# Patient Record
Sex: Female | Born: 1960
Health system: Southern US, Community
[De-identification: ages and names within clinical notes are randomized; demographics above are authoritative.]

## PROBLEM LIST (undated history)

## (undated) DIAGNOSIS — E039 Hypothyroidism, unspecified: Secondary | ICD-10-CM

## (undated) DIAGNOSIS — F329 Major depressive disorder, single episode, unspecified: Secondary | ICD-10-CM

## (undated) DIAGNOSIS — F32A Depression, unspecified: Secondary | ICD-10-CM

## (undated) DIAGNOSIS — M549 Dorsalgia, unspecified: Secondary | ICD-10-CM

## (undated) DIAGNOSIS — M79604 Pain in right leg: Secondary | ICD-10-CM

## (undated) DIAGNOSIS — M48061 Spinal stenosis, lumbar region without neurogenic claudication: Secondary | ICD-10-CM

## (undated) DIAGNOSIS — I10 Essential (primary) hypertension: Secondary | ICD-10-CM

## (undated) DIAGNOSIS — M25551 Pain in right hip: Secondary | ICD-10-CM

## (undated) DIAGNOSIS — I739 Peripheral vascular disease, unspecified: Secondary | ICD-10-CM

## (undated) DIAGNOSIS — M79605 Pain in left leg: Secondary | ICD-10-CM

## (undated) DIAGNOSIS — M419 Scoliosis, unspecified: Secondary | ICD-10-CM

## (undated) HISTORY — DX: Major depressive disorder, single episode, unspecified: F32.9

## (undated) HISTORY — DX: Hypothyroidism, unspecified: E03.9

## (undated) HISTORY — DX: Peripheral vascular disease, unspecified: I73.9

## (undated) HISTORY — DX: Essential (primary) hypertension: I10

## (undated) HISTORY — DX: Scoliosis, unspecified: M41.9

## (undated) HISTORY — DX: Pain in right leg: M79.605

## (undated) HISTORY — DX: Spinal stenosis, lumbar region without neurogenic claudication: M48.061

## (undated) HISTORY — DX: Depression, unspecified: F32.A

## (undated) HISTORY — DX: Pain in right leg: M79.604

## (undated) HISTORY — DX: Pain in left hip: M25.551

## (undated) HISTORY — DX: Dorsalgia, unspecified: M54.9

---

## 1999-05-18 ENCOUNTER — Other Ambulatory Visit: Admission: RE | Admit: 1999-05-18 | Discharge: 1999-05-18 | Payer: Self-pay | Admitting: Obstetrics & Gynecology

## 2000-08-12 ENCOUNTER — Other Ambulatory Visit: Admission: RE | Admit: 2000-08-12 | Discharge: 2000-08-12 | Payer: Self-pay | Admitting: Obstetrics & Gynecology

## 2001-10-14 ENCOUNTER — Other Ambulatory Visit: Admission: RE | Admit: 2001-10-14 | Discharge: 2001-10-14 | Payer: Self-pay | Admitting: Obstetrics & Gynecology

## 2003-08-31 ENCOUNTER — Other Ambulatory Visit: Admission: RE | Admit: 2003-08-31 | Discharge: 2003-08-31 | Payer: Self-pay | Admitting: Obstetrics & Gynecology

## 2004-12-26 ENCOUNTER — Other Ambulatory Visit: Admission: RE | Admit: 2004-12-26 | Discharge: 2004-12-26 | Payer: Self-pay | Admitting: Obstetrics & Gynecology

## 2009-10-04 ENCOUNTER — Encounter (INDEPENDENT_AMBULATORY_CARE_PROVIDER_SITE_OTHER): Payer: Self-pay | Admitting: *Deleted

## 2009-11-17 ENCOUNTER — Encounter (INDEPENDENT_AMBULATORY_CARE_PROVIDER_SITE_OTHER): Payer: Self-pay | Admitting: *Deleted

## 2009-11-18 ENCOUNTER — Ambulatory Visit: Payer: Self-pay | Admitting: Internal Medicine

## 2009-12-05 ENCOUNTER — Ambulatory Visit: Payer: Self-pay | Admitting: Internal Medicine

## 2011-01-16 NOTE — Letter (Signed)
Summary: [facility] Instructions  [facility]  [address] [place], [place] [address]   Phone: [phone]  Fax: [phone]       Cynthia Schaefer    02/14/1961    MRN: [id number]        Procedure Day /Date: Monday 12/05/2009     Arrival Time 7:30 am      Procedure Time: 8:30 am     Location of Procedure:                    _x _  [facility] (4th Floor)                        PREPARATION FOR COLONOSCOPY WITH MOVIPREP   Starting 5 days prior to your procedure Wednesday 12/15 do not eat nuts, seeds, popcorn, corn, beans, peas,  salads, or any raw vegetables.  Do not take any fiber supplements (e.g. Metamucil, Citrucel, and Benefiber).  THE DAY BEFORE YOUR PROCEDURE         DATE: 12/19   DAY: Sunday  1.  Drink clear liquids the entire day-NO SOLID FOOD  2.  Do not drink anything colored red or purple.  Avoid juices with pulp.  No orange juice.  3.  Drink at least 64 oz. (8 glasses) of fluid/clear liquids during the day to prevent dehydration and help the prep work efficiently.  CLEAR LIQUIDS INCLUDE: Water Jello Ice Popsicles Tea (sugar ok, no milk/cream) Powdered fruit flavored drinks Coffee (sugar ok, no milk/cream) Gatorade Juice: apple, white grape, white cranberry  Lemonade Clear bullion, consomm, broth Carbonated beverages (any kind) Strained chicken noodle soup Hard Candy                             4.  In the morning, mix first dose of MoviPrep solution:    Empty 1 Pouch A and 1 Pouch B into the disposable container    Add lukewarm drinking water to the top line of the container. Mix to dissolve    Refrigerate (mixed solution should be used within 24 hrs)  5.  Begin drinking the prep at 5:00 p.m. The MoviPrep container is divided by 4 marks.   Every 15 minutes drink the solution down to the next mark (approximately 8 oz) until the full liter is complete.   6.  Follow completed prep with 16 oz of clear liquid of your choice  (Nothing red or purple).  Continue to drink clear liquids until bedtime.  7.  Before going to bed, mix second dose of MoviPrep solution:    Empty 1 Pouch A and 1 Pouch B into the disposable container    Add lukewarm drinking water to the top line of the container. Mix to dissolve    Refrigerate  THE DAY OF YOUR PROCEDURE      DATE: 12/20  DAY: Monday  Beginning at 3:30 a.m. (5 hours before procedure):         1. Every 15 minutes, drink the solution down to the next mark (approx 8 oz) until the full liter is complete.  2. Follow completed prep with 16 oz. of clear liquid of your choice.    3. You may drink clear liquids until  6:30 am (2 HOURS BEFORE PROCEDURE).   MEDICATION INSTRUCTIONS  Unless otherwise instructed, you should take regular prescription medications with a small sip of water   as early  as possible the morning of your procedure.        OTHER INSTRUCTIONS  You will need a responsible adult at least 52 years of age to accompany you and drive you home.   This person must remain in the waiting room during your procedure.  Wear loose fitting clothing that is easily removed.  Leave jewelry and other valuables at home.  However, you may wish to bring a book to read or  an iPod/MP3 player to listen to music as you wait for your procedure to start.  Remove all body piercing jewelry and leave at home.  Total time from sign-in until discharge is approximately 2-3 hours.  You should go home directly after your procedure and rest.  You can resume normal activities the  day after your procedure.  The day of your procedure you should not:   Drive   Make legal decisions   Operate machinery   Drink alcohol   Return to work  You will receive specific instructions about eating, activities and medications before you leave.    The above instructions have been reviewed and explained to me by  Ezra Sites RN  November 18, 2009 11:21 AM    I fully understand  and can verbalize these instructions _____________________________ Date _________

## 2011-01-16 NOTE — Procedures (Signed)
Summary: Colonoscopy  Patient: Cynthia Schaefer Note: All result statuses are Final unless otherwise noted.  Tests: (1) Colonoscopy (COL)   COL Colonoscopy           DONE     [facility]     [address]     [place], [place]  [address]           COLONOSCOPY PROCEDURE REPORT           PATIENT:  [name], Shave  MR#:  [id number]     BIRTHDATE:  12-May-1961, 48 yrs. old  GENDER:  female           ENDOSCOPIST:  Wilhemina Bonito. Eda Keys, MD     Referred by:  .Direct Self,           PROCEDURE DATE:  12/05/2009     PROCEDURE:  Colonoscopy with snare polypectomy     EXTENDED SERVICE (TIME > 40 MIN)     ASA CLASS:  Class II     INDICATIONS:  Routine Risk Screening           MEDICATIONS:   Fentanyl 75 mcg IV, Versed 8 mg IV           DESCRIPTION OF PROCEDURE:   After the risks benefits and     alternatives of the procedure were thoroughly explained, informed     consent was obtained.  Digital rectal exam was performed and     revealed no abnormalities.   The LB CF-H180AL [id number] and LB     CF-H180AL [id number] endoscope was introduced through the anus and     advanced to the cecum, which was identified by both the appendix     and ileocecal valve, limited by fair prep.    The quality of the     prep was Moviprep fair. Time to cecum = 4:58 min. The instrument     was then slowly withdrawn (time = 31:40 min) as the colon was     fully examined.     <<PROCEDUREIMAGES>>           FINDINGS:  The prep was fair and compromising. However, with     extended time for irrigation and suctioning (actually had to     exchange scopes as well due to clogged channel) and great effort     it was upgraded to good. A diminutive adenomatous appearring polyp     was found in the sigmoid colon. Polyp was snared without cautery.     Retrieval was NOT successful.   Mild diverticulosis was found in     the sigmoid colon.  This was otherwise a normal examination of the     colon.   Retroflexed views in the  rectum revealed no     abnormalities.    The scope was then withdrawn from the patient     and the procedure completed.           COMPLICATIONS:  None           ENDOSCOPIC IMPRESSION:     1) Diminutive polyp in the sigmoid colon -removed     2) Mild diverticulosis in the sigmoid colon     3) Otherwise normal examination           RECOMMENDATIONS:     1) Follow up colonoscopy in 5 years           ______________________________     Wilhemina Bonito. Eda Keys, MD  CC:  Desmond Dike, MD; The Patient           n.     eSIGNED:   Wilhemina Bonito. Eda Keys at 12/05/2009 09:32 AM           Sabino Donovan, [id number]  Note: An exclamation mark (!) indicates a result that was not dispersed into the flowsheet. Document Creation Date: 12/05/2009 9:30 AM _______________________________________________________________________  (1) Order result status: Final Collection or observation date-time: 12/05/2009 09:22 Requested date-time:  Receipt date-time:  Reported date-time:  Referring Physician:   Ordering Physician: Fransico Setters [phone]) Specimen Source:  Source: Launa Grill Order Number: [id number] Lab site:

## 2011-01-16 NOTE — Miscellaneous (Signed)
Summary: [facility]  Clinical Lists Changes  Medications: Added new medication of MOVIPREP 100 GM  SOLR (PEG-KCL-NACL-NASULF-NA ASC-C) As per prep instructions. - Signed Rx of MOVIPREP 100 GM  SOLR (PEG-KCL-NACL-NASULF-NA ASC-C) As per prep instructions.;  #1 x 0;  Signed;  Entered by: Ezra Sites RN;  Authorized by: Hilarie Fredrickson MD;  Method used: Electronically to [facility]  [address] [phone]*, [address] [place], [place], [place]  [address], Ph: [phone] or [phone], Fax: [phone] Allergies: Added new allergy or adverse reaction of FLAGYL Observations: Added new observation of NKA: F (11/18/2009 10:58)    Prescriptions: MOVIPREP 100 GM  SOLR (PEG-KCL-NACL-NASULF-NA ASC-C) As per prep instructions.  #1 x 0   Entered by:   Ezra Sites RN   Authorized by:   Hilarie Fredrickson MD   Signed by:   Ezra Sites RN on 11/18/2009   Method used:   Electronically to        [facility]  [address] [phone]* (retail)       [address]       [place], [place]  [address]       Ph: [phone] or [phone]       Fax: [phone]   RxID:   [id number]

## 2011-01-16 NOTE — Letter (Signed)
Summary: Previsit letter  [facility]  [address] [place], [place] [address]   Phone: [phone]  Fax: [phone]     10/04/2009 MRN: [id number]  [facility] [address] [place], [place]  [address]  Dear Cynthia Schaefer,  Welcome to the Gastroenterology Division at [facility].    You are scheduled to see a nurse for your pre-procedure visit on 11/18/2009 at 11AM on the 3rd floor at [facility], [address]  We ask that you try to arrive at our office 15 minutes prior to your appointment time to allow for check-in.  Your nurse visit will consist of discussing your medical and surgical history, your immediate family medical history, and your medications.    Please bring a complete list of all your medications or, if you prefer, bring the medication bottles and we will list them.  We will need to be aware of both prescribed and over the counter drugs.  We will need to know exact dosage information as well.  If you are on blood thinners (Coumadin, Plavix, Aggrenox, Ticlid, etc.) please call our office today/prior to your appointment, as we need to consult with your physician about holding your medication.   Please be prepared to read and sign documents such as consent forms, a financial agreement, and acknowledgement forms.  If necessary, and with your consent, a friend or relative is welcome to sit-in on the nurse visit with you.  Please bring your insurance card so that we may make a copy of it.  If your insurance requires a referral to see a specialist, please bring your referral form from your primary care physician.  No co-pay is required for this nurse visit.     If you cannot keep your appointment, please call [phone] to cancel or reschedule prior to your appointment date.  This allows [place] the opportunity to schedule an appointment for another patient in need of care.    Thank you for choosing [facility] for your medical  needs.  We appreciate the opportunity to care for you.  Please visit [place] at our website  to learn more about our practice.                     Sincerely.                                                                                                                   The Gastroenterology Division

## 2014-09-29 ENCOUNTER — Encounter: Payer: Self-pay | Admitting: Internal Medicine

## 2014-10-08 ENCOUNTER — Encounter: Payer: Self-pay | Admitting: Internal Medicine

## 2014-10-27 ENCOUNTER — Telehealth: Payer: Self-pay | Admitting: *Deleted

## 2014-10-27 NOTE — Telephone Encounter (Signed)
Thanks. entered into prep.  Cynthia Schaefer   PV

## 2014-10-27 NOTE — Telephone Encounter (Signed)
Dr Marina GoodellPerry, Reviewing this chart this am for Cynthia Schaefer. Noticed at her last colon 12-05-09 she used moviprep. In your report you states her prep was fair and compromising and needed extensive irrigation. She has hx of colon polyps. Do you want her to have a more extensive prep for her colon 12-3?  Please advise.  Thanks, marie Cynthia Schaefer

## 2014-10-27 NOTE — Telephone Encounter (Signed)
Clears for 2 days. One bottle of mag citrate am day before, then standard Movi prep. Thanks

## 2014-11-18 ENCOUNTER — Encounter: Payer: Self-pay | Admitting: Internal Medicine

## 2015-01-28 ENCOUNTER — Encounter: Payer: Self-pay | Admitting: Internal Medicine

## 2015-03-24 ENCOUNTER — Ambulatory Visit (AMBULATORY_SURGERY_CENTER): Payer: Self-pay | Admitting: *Deleted

## 2015-03-24 VITALS — Ht 66.5 in | Wt 199.0 lb

## 2015-03-24 DIAGNOSIS — Z86010 Personal history of colonic polyps: Secondary | ICD-10-CM

## 2015-03-24 MED ORDER — MOVIPREP 100 G PO SOLR: 1.0000 | Freq: Once | ORAL | Status: DC

## 2015-03-24 NOTE — Progress Notes (Signed)
Denies allergies to eggs or soy products. Denies complications with sedation or anesthesia. Denies O2 use. Denies use of diet or weight loss medications.  Emmi instructions given for colonoscopy.  

## 2015-03-28 ENCOUNTER — Encounter: Payer: Self-pay | Admitting: Internal Medicine

## 2015-04-08 ENCOUNTER — Encounter: Payer: Self-pay | Admitting: Internal Medicine

## 2015-04-08 ENCOUNTER — Ambulatory Visit (AMBULATORY_SURGERY_CENTER): Payer: BLUE CROSS/BLUE SHIELD | Admitting: Internal Medicine

## 2015-04-08 VITALS — BP 130/94 | HR 66 | Temp 99.1°F | Resp 17 | Ht 66.0 in | Wt 199.0 lb

## 2015-04-08 DIAGNOSIS — Z86010 Personal history of colonic polyps: Secondary | ICD-10-CM

## 2015-04-08 MED ORDER — SODIUM CHLORIDE 0.9 % IV SOLN: 500.0000 mL | INTRAVENOUS | Status: DC

## 2015-04-08 NOTE — Patient Instructions (Signed)
YOU HAD AN ENDOSCOPIC PROCEDURE TODAY AT THE [facility]:   Refer to the procedure report that was given to you for any specific questions about what was found during the examination.  If the procedure report does not answer your questions, please call your gastroenterologist to clarify.  If you requested that your care partner not be given the details of your procedure findings, then the procedure report has been included in a sealed envelope for you to review at your convenience later.  YOU SHOULD EXPECT: Some feelings of bloating in the abdomen. Passage of more gas than usual.  Walking can help get rid of the air that was put into your GI tract during the procedure and reduce the bloating. If you had a lower endoscopy (such as a colonoscopy or flexible sigmoidoscopy) you may notice spotting of blood in your stool or on the toilet paper. If you underwent a bowel prep for your procedure, you may not have a normal bowel movement for a few days.  Please Note:  You might notice some irritation and congestion in your nose or some drainage.  This is from the oxygen used during your procedure.  There is no need for concern and it should clear up in a day or so.  SYMPTOMS TO REPORT IMMEDIATELY:   Following lower endoscopy (colonoscopy or flexible sigmoidoscopy):  Excessive amounts of blood in the stool  Significant tenderness or worsening of abdominal pains  Swelling of the abdomen that is new, acute  Fever of 100F or higher    For urgent or emergent issues, a gastroenterologist can be reached at any hour by calling [phone].   DIET: Your first meal following the procedure should be a small meal and then it is ok to progress to your normal diet. Heavy or fried foods are harder to digest and may make you feel nauseous or bloated.  Likewise, meals heavy in dairy and vegetables can increase bloating.  Drink plenty of fluids but you should avoid alcoholic beverages for 24  hours.  ACTIVITY:  You should plan to take it easy for the rest of today and you should NOT DRIVE or use heavy machinery until tomorrow (because of the sedation medicines used during the test).    FOLLOW UP: Our staff will call the number listed on your records the next business day following your procedure to check on you and address any questions or concerns that you may have regarding the information given to you following your procedure. If we do not reach you, we will leave a message.  However, if you are feeling well and you are not experiencing any problems, there is no need to return our call.  We will assume that you have returned to your regular daily activities without incident.  If any biopsies were taken you will be contacted by phone or by letter within the next 1-3 weeks.  Please call us at [phone] if you have not heard about the biopsies in 3 weeks.    SIGNATURES/CONFIDENTIALITY: You and/or your care partner have signed paperwork which will be entered into your electronic medical record.  These signatures attest to the fact that that the information above on your After Visit Summary has been reviewed and is understood.  Full responsibility of the confidentiality of this discharge information lies with you and/or your care-partner.   Information on diverticulosis & high fiber diet given to you today  

## 2015-04-08 NOTE — Progress Notes (Signed)
A/ox3 pleased with MAC, report to Penny RN 

## 2015-04-08 NOTE — Op Note (Signed)
[facility] [address] [place], [id number]   COLONOSCOPY PROCEDURE REPORT  PATIENT: Cynthia Schaefer, Casady J  MR#: [id number] BIRTHDATE: 1961/09/27 , 53  yrs. old GENDER: female ENDOSCOPIST: Roxy CedarJohn N Rache Klimaszewski Jr, MD REFERRED OZ:[id number]:Surveillance Program Recall PROCEDURE DATE:  04/08/2015 PROCEDURE:   Colonoscopy, surveillance First Screening Colonoscopy - Avg.  risk and is 50 yrs.  old or older - No.  Prior Negative Screening - Now for repeat screening. N/A  History of Adenoma - Now for follow-up colonoscopy & has been > or = to 3 yrs.  Yes hx of adenoma.  Has been 3 or more years since last colonoscopy. ASA CLASS:   Class II INDICATIONS:Surveillance due to prior colonic neoplasia and PH Colon Adenoma.   Prior exam 11-2009 (Fair prep; adenomatous looking polyp removed, not retrieved) MEDICATIONS: Monitored anesthesia care and Propofol 200 mg IV  DESCRIPTION OF PROCEDURE:   After the risks benefits and alternatives of the procedure were thoroughly explained, informed consent was obtained.  The digital rectal exam revealed no abnormalities of the rectum.   The LB [id number]  endoscope was introduced through the anus and advanced to the cecum, which was identified by both the appendix and ileocecal valve. No adverse events experienced.   The quality of the prep was good.  (MoviPrep was used)  The instrument was then slowly withdrawn as the colon was fully examined.      COLON FINDINGS: There was mild diverticulosis noted in the sigmoid colon.   The examination was otherwise normal.  Retroflexed views revealed no abnormalities. The time to cecum = 4.6 Withdrawal time = 15.0   The scope was withdrawn and the procedure completed. COMPLICATIONS: There were no immediate complications.  ENDOSCOPIC IMPRESSION: 1.   Mild diverticulosis was noted in the sigmoid colon 2.   The examination was otherwise normal  RECOMMENDATIONS: 1. Continue current colorectal screening  recommendations for "routine risk" patients with a repeat colonoscopy in 10 years.  eSigned:  Roxy CedarJohn N Esmay Amspacher Jr, MD 04/08/2015 10:21 AM   cc: The Patient and Blane Oharaox, Kirsten MD

## 2015-04-11 ENCOUNTER — Telehealth: Payer: Self-pay

## 2015-04-11 NOTE — Telephone Encounter (Signed)
No answer, left voicemail message.

## 2015-06-10 ENCOUNTER — Encounter: Payer: Self-pay | Admitting: Internal Medicine

## 2016-09-27 DIAGNOSIS — F320 Major depressive disorder, single episode, mild: Secondary | ICD-10-CM | POA: Diagnosis not present

## 2016-09-27 DIAGNOSIS — Z79899 Other long term (current) drug therapy: Secondary | ICD-10-CM | POA: Diagnosis not present

## 2016-09-27 DIAGNOSIS — K5909 Other constipation: Secondary | ICD-10-CM | POA: Diagnosis not present

## 2016-09-27 DIAGNOSIS — E038 Other specified hypothyroidism: Secondary | ICD-10-CM | POA: Diagnosis not present

## 2016-09-27 DIAGNOSIS — E782 Mixed hyperlipidemia: Secondary | ICD-10-CM | POA: Diagnosis not present

## 2016-10-10 DIAGNOSIS — M48062 Spinal stenosis, lumbar region with neurogenic claudication: Secondary | ICD-10-CM | POA: Diagnosis not present

## 2016-10-10 DIAGNOSIS — M5416 Radiculopathy, lumbar region: Secondary | ICD-10-CM | POA: Diagnosis not present

## 2016-10-10 DIAGNOSIS — Z6834 Body mass index (BMI) 34.0-34.9, adult: Secondary | ICD-10-CM | POA: Diagnosis not present

## 2016-10-10 DIAGNOSIS — R030 Elevated blood-pressure reading, without diagnosis of hypertension: Secondary | ICD-10-CM | POA: Diagnosis not present

## 2016-10-25 DIAGNOSIS — E663 Overweight: Secondary | ICD-10-CM | POA: Diagnosis not present

## 2016-10-25 DIAGNOSIS — Z0000 Encounter for general adult medical examination without abnormal findings: Secondary | ICD-10-CM | POA: Diagnosis not present

## 2016-10-25 DIAGNOSIS — Z6832 Body mass index (BMI) 32.0-32.9, adult: Secondary | ICD-10-CM | POA: Diagnosis not present

## 2016-10-25 DIAGNOSIS — Z1211 Encounter for screening for malignant neoplasm of colon: Secondary | ICD-10-CM | POA: Diagnosis not present

## 2016-11-15 DIAGNOSIS — Z1211 Encounter for screening for malignant neoplasm of colon: Secondary | ICD-10-CM | POA: Diagnosis not present

## 2016-12-24 DIAGNOSIS — R5081 Fever presenting with conditions classified elsewhere: Secondary | ICD-10-CM | POA: Diagnosis not present

## 2016-12-24 DIAGNOSIS — J208 Acute bronchitis due to other specified organisms: Secondary | ICD-10-CM | POA: Diagnosis not present

## 2016-12-24 DIAGNOSIS — J018 Other acute sinusitis: Secondary | ICD-10-CM | POA: Diagnosis not present

## 2017-06-11 DIAGNOSIS — M48062 Spinal stenosis, lumbar region with neurogenic claudication: Secondary | ICD-10-CM | POA: Diagnosis not present

## 2017-06-11 DIAGNOSIS — M4120 Other idiopathic scoliosis, site unspecified: Secondary | ICD-10-CM | POA: Diagnosis not present

## 2017-08-20 DIAGNOSIS — Z01419 Encounter for gynecological examination (general) (routine) without abnormal findings: Secondary | ICD-10-CM | POA: Diagnosis not present

## 2017-08-20 DIAGNOSIS — Z6832 Body mass index (BMI) 32.0-32.9, adult: Secondary | ICD-10-CM | POA: Diagnosis not present

## 2017-08-20 DIAGNOSIS — Z1231 Encounter for screening mammogram for malignant neoplasm of breast: Secondary | ICD-10-CM | POA: Diagnosis not present

## 2017-09-04 DIAGNOSIS — R600 Localized edema: Secondary | ICD-10-CM | POA: Diagnosis not present

## 2017-09-04 DIAGNOSIS — W57XXXA Bitten or stung by nonvenomous insect and other nonvenomous arthropods, initial encounter: Secondary | ICD-10-CM | POA: Diagnosis not present

## 2017-11-01 DIAGNOSIS — Z23 Encounter for immunization: Secondary | ICD-10-CM | POA: Diagnosis not present

## 2017-11-01 DIAGNOSIS — Z0000 Encounter for general adult medical examination without abnormal findings: Secondary | ICD-10-CM | POA: Diagnosis not present

## 2018-01-13 DIAGNOSIS — S61412A Laceration without foreign body of left hand, initial encounter: Secondary | ICD-10-CM | POA: Diagnosis not present

## 2018-08-08 DIAGNOSIS — F320 Major depressive disorder, single episode, mild: Secondary | ICD-10-CM | POA: Diagnosis not present

## 2018-08-08 DIAGNOSIS — M170 Bilateral primary osteoarthritis of knee: Secondary | ICD-10-CM | POA: Diagnosis not present

## 2018-08-08 DIAGNOSIS — E782 Mixed hyperlipidemia: Secondary | ICD-10-CM | POA: Diagnosis not present

## 2018-08-08 DIAGNOSIS — E038 Other specified hypothyroidism: Secondary | ICD-10-CM | POA: Diagnosis not present

## 2018-09-15 DIAGNOSIS — Z01419 Encounter for gynecological examination (general) (routine) without abnormal findings: Secondary | ICD-10-CM | POA: Diagnosis not present

## 2018-09-15 DIAGNOSIS — Z1231 Encounter for screening mammogram for malignant neoplasm of breast: Secondary | ICD-10-CM | POA: Diagnosis not present

## 2018-09-15 DIAGNOSIS — Z124 Encounter for screening for malignant neoplasm of cervix: Secondary | ICD-10-CM | POA: Diagnosis not present

## 2018-09-15 DIAGNOSIS — Z6832 Body mass index (BMI) 32.0-32.9, adult: Secondary | ICD-10-CM | POA: Diagnosis not present

## 2018-09-23 DIAGNOSIS — Z23 Encounter for immunization: Secondary | ICD-10-CM | POA: Diagnosis not present

## 2019-01-15 DIAGNOSIS — E034 Atrophy of thyroid (acquired): Secondary | ICD-10-CM | POA: Diagnosis not present

## 2019-03-04 DIAGNOSIS — E034 Atrophy of thyroid (acquired): Secondary | ICD-10-CM | POA: Diagnosis not present

## 2019-09-22 DIAGNOSIS — Z6832 Body mass index (BMI) 32.0-32.9, adult: Secondary | ICD-10-CM | POA: Diagnosis not present

## 2019-09-22 DIAGNOSIS — Z1231 Encounter for screening mammogram for malignant neoplasm of breast: Secondary | ICD-10-CM | POA: Diagnosis not present

## 2019-09-22 DIAGNOSIS — Z01419 Encounter for gynecological examination (general) (routine) without abnormal findings: Secondary | ICD-10-CM | POA: Diagnosis not present

## 2019-10-12 DIAGNOSIS — M5136 Other intervertebral disc degeneration, lumbar region: Secondary | ICD-10-CM | POA: Diagnosis not present

## 2019-10-12 DIAGNOSIS — M48062 Spinal stenosis, lumbar region with neurogenic claudication: Secondary | ICD-10-CM | POA: Diagnosis not present

## 2019-10-12 DIAGNOSIS — M5416 Radiculopathy, lumbar region: Secondary | ICD-10-CM | POA: Diagnosis not present

## 2019-10-12 DIAGNOSIS — M5137 Other intervertebral disc degeneration, lumbosacral region: Secondary | ICD-10-CM | POA: Diagnosis not present

## 2019-10-14 DIAGNOSIS — M48062 Spinal stenosis, lumbar region with neurogenic claudication: Secondary | ICD-10-CM | POA: Diagnosis not present

## 2019-10-14 DIAGNOSIS — R030 Elevated blood-pressure reading, without diagnosis of hypertension: Secondary | ICD-10-CM | POA: Diagnosis not present

## 2019-10-14 DIAGNOSIS — M5416 Radiculopathy, lumbar region: Secondary | ICD-10-CM | POA: Diagnosis not present

## 2019-10-21 DIAGNOSIS — E034 Atrophy of thyroid (acquired): Secondary | ICD-10-CM | POA: Diagnosis not present

## 2019-10-21 DIAGNOSIS — E782 Mixed hyperlipidemia: Secondary | ICD-10-CM | POA: Diagnosis not present

## 2019-12-03 DIAGNOSIS — E034 Atrophy of thyroid (acquired): Secondary | ICD-10-CM | POA: Diagnosis not present

## 2019-12-03 DIAGNOSIS — E038 Other specified hypothyroidism: Secondary | ICD-10-CM | POA: Diagnosis not present

## 2019-12-03 DIAGNOSIS — N3000 Acute cystitis without hematuria: Secondary | ICD-10-CM | POA: Diagnosis not present

## 2019-12-16 DIAGNOSIS — M48062 Spinal stenosis, lumbar region with neurogenic claudication: Secondary | ICD-10-CM | POA: Diagnosis not present

## 2019-12-16 DIAGNOSIS — Z6825 Body mass index (BMI) 25.0-25.9, adult: Secondary | ICD-10-CM | POA: Diagnosis not present

## 2019-12-16 DIAGNOSIS — I10 Essential (primary) hypertension: Secondary | ICD-10-CM | POA: Diagnosis not present

## 2020-02-12 ENCOUNTER — Other Ambulatory Visit: Payer: Self-pay | Admitting: Family Medicine

## 2020-05-11 ENCOUNTER — Other Ambulatory Visit: Payer: Self-pay | Admitting: Family Medicine

## 2020-05-28 ENCOUNTER — Other Ambulatory Visit: Payer: Self-pay | Admitting: Physician Assistant

## 2020-06-28 DIAGNOSIS — M48062 Spinal stenosis, lumbar region with neurogenic claudication: Secondary | ICD-10-CM | POA: Diagnosis not present

## 2020-07-08 DIAGNOSIS — R5382 Chronic fatigue, unspecified: Secondary | ICD-10-CM | POA: Diagnosis not present

## 2020-07-08 DIAGNOSIS — E039 Hypothyroidism, unspecified: Secondary | ICD-10-CM | POA: Diagnosis not present

## 2020-07-08 DIAGNOSIS — E785 Hyperlipidemia, unspecified: Secondary | ICD-10-CM | POA: Diagnosis not present

## 2020-07-08 DIAGNOSIS — I10 Essential (primary) hypertension: Secondary | ICD-10-CM | POA: Diagnosis not present

## 2020-07-08 DIAGNOSIS — M159 Polyosteoarthritis, unspecified: Secondary | ICD-10-CM | POA: Diagnosis not present

## 2020-07-12 DIAGNOSIS — M48062 Spinal stenosis, lumbar region with neurogenic claudication: Secondary | ICD-10-CM | POA: Diagnosis not present

## 2020-07-12 DIAGNOSIS — M545 Low back pain: Secondary | ICD-10-CM | POA: Diagnosis not present

## 2020-07-13 ENCOUNTER — Encounter: Payer: BLUE CROSS/BLUE SHIELD | Admitting: Family Medicine

## 2020-07-14 DIAGNOSIS — M5137 Other intervertebral disc degeneration, lumbosacral region: Secondary | ICD-10-CM | POA: Diagnosis not present

## 2020-07-14 DIAGNOSIS — M4120 Other idiopathic scoliosis, site unspecified: Secondary | ICD-10-CM | POA: Diagnosis not present

## 2020-07-29 DIAGNOSIS — M4807 Spinal stenosis, lumbosacral region: Secondary | ICD-10-CM | POA: Diagnosis not present

## 2020-07-29 DIAGNOSIS — M4120 Other idiopathic scoliosis, site unspecified: Secondary | ICD-10-CM | POA: Diagnosis not present

## 2020-07-29 DIAGNOSIS — M5137 Other intervertebral disc degeneration, lumbosacral region: Secondary | ICD-10-CM | POA: Diagnosis not present

## 2020-07-29 DIAGNOSIS — I700 Atherosclerosis of aorta: Secondary | ICD-10-CM | POA: Diagnosis not present

## 2020-07-29 DIAGNOSIS — M5136 Other intervertebral disc degeneration, lumbar region: Secondary | ICD-10-CM | POA: Diagnosis not present

## 2020-07-29 DIAGNOSIS — M48061 Spinal stenosis, lumbar region without neurogenic claudication: Secondary | ICD-10-CM | POA: Diagnosis not present

## 2020-07-29 DIAGNOSIS — M47817 Spondylosis without myelopathy or radiculopathy, lumbosacral region: Secondary | ICD-10-CM | POA: Diagnosis not present

## 2020-08-09 DIAGNOSIS — F3341 Major depressive disorder, recurrent, in partial remission: Secondary | ICD-10-CM | POA: Diagnosis not present

## 2020-08-09 DIAGNOSIS — E785 Hyperlipidemia, unspecified: Secondary | ICD-10-CM | POA: Diagnosis not present

## 2020-08-09 DIAGNOSIS — I10 Essential (primary) hypertension: Secondary | ICD-10-CM | POA: Diagnosis not present

## 2020-08-09 DIAGNOSIS — M159 Polyosteoarthritis, unspecified: Secondary | ICD-10-CM | POA: Diagnosis not present

## 2020-08-12 DIAGNOSIS — M4807 Spinal stenosis, lumbosacral region: Secondary | ICD-10-CM | POA: Diagnosis not present

## 2020-08-12 DIAGNOSIS — M4120 Other idiopathic scoliosis, site unspecified: Secondary | ICD-10-CM | POA: Diagnosis not present

## 2020-08-31 DIAGNOSIS — F3341 Major depressive disorder, recurrent, in partial remission: Secondary | ICD-10-CM | POA: Diagnosis not present

## 2020-08-31 DIAGNOSIS — I10 Essential (primary) hypertension: Secondary | ICD-10-CM | POA: Diagnosis not present

## 2020-08-31 DIAGNOSIS — M159 Polyosteoarthritis, unspecified: Secondary | ICD-10-CM | POA: Diagnosis not present

## 2020-08-31 DIAGNOSIS — E785 Hyperlipidemia, unspecified: Secondary | ICD-10-CM | POA: Diagnosis not present

## 2020-09-28 DIAGNOSIS — F3341 Major depressive disorder, recurrent, in partial remission: Secondary | ICD-10-CM | POA: Diagnosis not present

## 2020-09-28 DIAGNOSIS — M25562 Pain in left knee: Secondary | ICD-10-CM | POA: Diagnosis not present

## 2020-09-28 DIAGNOSIS — E785 Hyperlipidemia, unspecified: Secondary | ICD-10-CM | POA: Diagnosis not present

## 2020-09-28 DIAGNOSIS — M159 Polyosteoarthritis, unspecified: Secondary | ICD-10-CM | POA: Diagnosis not present

## 2020-09-28 DIAGNOSIS — Z23 Encounter for immunization: Secondary | ICD-10-CM | POA: Diagnosis not present

## 2020-10-05 DIAGNOSIS — F3341 Major depressive disorder, recurrent, in partial remission: Secondary | ICD-10-CM | POA: Diagnosis not present

## 2020-10-05 DIAGNOSIS — M25561 Pain in right knee: Secondary | ICD-10-CM | POA: Diagnosis not present

## 2020-10-05 DIAGNOSIS — I10 Essential (primary) hypertension: Secondary | ICD-10-CM | POA: Diagnosis not present

## 2020-10-05 DIAGNOSIS — E039 Hypothyroidism, unspecified: Secondary | ICD-10-CM | POA: Diagnosis not present

## 2020-10-05 DIAGNOSIS — E785 Hyperlipidemia, unspecified: Secondary | ICD-10-CM | POA: Diagnosis not present

## 2020-10-12 DIAGNOSIS — E039 Hypothyroidism, unspecified: Secondary | ICD-10-CM | POA: Diagnosis not present

## 2020-10-12 DIAGNOSIS — I10 Essential (primary) hypertension: Secondary | ICD-10-CM | POA: Diagnosis not present

## 2020-10-12 DIAGNOSIS — E785 Hyperlipidemia, unspecified: Secondary | ICD-10-CM | POA: Diagnosis not present

## 2020-10-12 DIAGNOSIS — E669 Obesity, unspecified: Secondary | ICD-10-CM | POA: Diagnosis not present

## 2020-10-12 DIAGNOSIS — M25561 Pain in right knee: Secondary | ICD-10-CM | POA: Diagnosis not present

## 2020-10-13 DIAGNOSIS — Z6832 Body mass index (BMI) 32.0-32.9, adult: Secondary | ICD-10-CM | POA: Diagnosis not present

## 2020-10-13 DIAGNOSIS — M4120 Other idiopathic scoliosis, site unspecified: Secondary | ICD-10-CM | POA: Diagnosis not present

## 2020-10-13 DIAGNOSIS — E559 Vitamin D deficiency, unspecified: Secondary | ICD-10-CM | POA: Diagnosis not present

## 2020-10-13 DIAGNOSIS — M48062 Spinal stenosis, lumbar region with neurogenic claudication: Secondary | ICD-10-CM | POA: Diagnosis not present

## 2020-10-13 DIAGNOSIS — R030 Elevated blood-pressure reading, without diagnosis of hypertension: Secondary | ICD-10-CM | POA: Diagnosis not present

## 2020-10-19 DIAGNOSIS — M25562 Pain in left knee: Secondary | ICD-10-CM | POA: Diagnosis not present

## 2020-10-19 DIAGNOSIS — E785 Hyperlipidemia, unspecified: Secondary | ICD-10-CM | POA: Diagnosis not present

## 2020-10-19 DIAGNOSIS — I10 Essential (primary) hypertension: Secondary | ICD-10-CM | POA: Diagnosis not present

## 2020-10-19 DIAGNOSIS — E039 Hypothyroidism, unspecified: Secondary | ICD-10-CM | POA: Diagnosis not present

## 2020-10-25 DIAGNOSIS — M5459 Other low back pain: Secondary | ICD-10-CM | POA: Diagnosis not present

## 2020-10-25 DIAGNOSIS — M4120 Other idiopathic scoliosis, site unspecified: Secondary | ICD-10-CM | POA: Diagnosis not present

## 2020-10-25 DIAGNOSIS — M4807 Spinal stenosis, lumbosacral region: Secondary | ICD-10-CM | POA: Diagnosis not present

## 2020-10-26 DIAGNOSIS — E039 Hypothyroidism, unspecified: Secondary | ICD-10-CM | POA: Diagnosis not present

## 2020-10-26 DIAGNOSIS — F3341 Major depressive disorder, recurrent, in partial remission: Secondary | ICD-10-CM | POA: Diagnosis not present

## 2020-10-26 DIAGNOSIS — E785 Hyperlipidemia, unspecified: Secondary | ICD-10-CM | POA: Diagnosis not present

## 2020-10-26 DIAGNOSIS — M159 Polyosteoarthritis, unspecified: Secondary | ICD-10-CM | POA: Diagnosis not present

## 2020-10-28 DIAGNOSIS — M4120 Other idiopathic scoliosis, site unspecified: Secondary | ICD-10-CM | POA: Diagnosis not present

## 2020-10-28 DIAGNOSIS — M5459 Other low back pain: Secondary | ICD-10-CM | POA: Diagnosis not present

## 2020-10-28 DIAGNOSIS — M4807 Spinal stenosis, lumbosacral region: Secondary | ICD-10-CM | POA: Diagnosis not present

## 2020-10-31 DIAGNOSIS — Z124 Encounter for screening for malignant neoplasm of cervix: Secondary | ICD-10-CM | POA: Diagnosis not present

## 2020-10-31 DIAGNOSIS — Z01419 Encounter for gynecological examination (general) (routine) without abnormal findings: Secondary | ICD-10-CM | POA: Diagnosis not present

## 2020-10-31 DIAGNOSIS — Z6831 Body mass index (BMI) 31.0-31.9, adult: Secondary | ICD-10-CM | POA: Diagnosis not present

## 2020-10-31 DIAGNOSIS — Z1231 Encounter for screening mammogram for malignant neoplasm of breast: Secondary | ICD-10-CM | POA: Diagnosis not present

## 2020-11-01 DIAGNOSIS — M5459 Other low back pain: Secondary | ICD-10-CM | POA: Diagnosis not present

## 2020-11-01 DIAGNOSIS — M4120 Other idiopathic scoliosis, site unspecified: Secondary | ICD-10-CM | POA: Diagnosis not present

## 2020-11-01 DIAGNOSIS — M4807 Spinal stenosis, lumbosacral region: Secondary | ICD-10-CM | POA: Diagnosis not present

## 2020-11-04 DIAGNOSIS — M4807 Spinal stenosis, lumbosacral region: Secondary | ICD-10-CM | POA: Diagnosis not present

## 2020-11-04 DIAGNOSIS — M4120 Other idiopathic scoliosis, site unspecified: Secondary | ICD-10-CM | POA: Diagnosis not present

## 2020-11-04 DIAGNOSIS — M5459 Other low back pain: Secondary | ICD-10-CM | POA: Diagnosis not present

## 2020-11-08 ENCOUNTER — Other Ambulatory Visit: Payer: Self-pay | Admitting: Neurosurgery

## 2020-11-09 ENCOUNTER — Other Ambulatory Visit: Payer: Self-pay

## 2020-11-09 DIAGNOSIS — M85851 Other specified disorders of bone density and structure, right thigh: Secondary | ICD-10-CM | POA: Diagnosis not present

## 2020-11-09 DIAGNOSIS — M4120 Other idiopathic scoliosis, site unspecified: Secondary | ICD-10-CM | POA: Diagnosis not present

## 2020-11-09 DIAGNOSIS — M8589 Other specified disorders of bone density and structure, multiple sites: Secondary | ICD-10-CM | POA: Diagnosis not present

## 2020-11-15 DIAGNOSIS — M4120 Other idiopathic scoliosis, site unspecified: Secondary | ICD-10-CM | POA: Diagnosis not present

## 2020-11-15 DIAGNOSIS — M4807 Spinal stenosis, lumbosacral region: Secondary | ICD-10-CM | POA: Diagnosis not present

## 2020-11-15 DIAGNOSIS — M5459 Other low back pain: Secondary | ICD-10-CM | POA: Diagnosis not present

## 2020-11-22 DIAGNOSIS — M48062 Spinal stenosis, lumbar region with neurogenic claudication: Secondary | ICD-10-CM | POA: Diagnosis not present

## 2020-11-29 ENCOUNTER — Other Ambulatory Visit: Payer: Self-pay

## 2020-12-06 ENCOUNTER — Encounter: Payer: Self-pay | Admitting: Vascular Surgery

## 2020-12-06 ENCOUNTER — Other Ambulatory Visit: Payer: Self-pay

## 2020-12-06 ENCOUNTER — Ambulatory Visit: Payer: BC Managed Care – PPO | Admitting: Vascular Surgery

## 2020-12-06 DIAGNOSIS — G8929 Other chronic pain: Secondary | ICD-10-CM | POA: Diagnosis not present

## 2020-12-06 DIAGNOSIS — M549 Dorsalgia, unspecified: Secondary | ICD-10-CM | POA: Insufficient documentation

## 2020-12-06 DIAGNOSIS — M5442 Lumbago with sciatica, left side: Secondary | ICD-10-CM

## 2020-12-06 NOTE — Progress Notes (Signed)
Patient name: Cynthia Schaefer MRN: [id number] DOB: [birth year] Sex: female  REASON FOR CONSULT: Evaluate for L5-S1 ALIF  HPI: Cynthia Schaefer is a 59 y.o. female, with history of hypertension, hyperlipidemia and chronic lower back pain who presents for evaluation of L5-S1 ALIF.  She describes years of lower back pain that has gotten worse.  She is now having radiculopathy in her left leg.  She has failed physical therapy injections and other conservative management.  She has been evaluated by Dr. Wynetta Emery with neurosurgery.  He has recommended ALIF at L5-S1 as well as L2-L3 and L3-L4 anterior lateral fusion and then later going back for posterior screw fixation at L2-S1.  Vascular surgery has been asked to assist with L5-S1 ALIF with anterior abdominal exposure.  She denies any history of abdominal surgery.  She has no incisions otherwise.  She denies any tobacco abuse.  Past Medical History:  Diagnosis Date  . Back pain    Severe  . Bilateral hip pain   . Bilateral leg pain   . Claudication (HCC)   . Depression   . Hypertension   . Hypothyroidism   . Scoliosis    L3-4  . Spinal stenosis of lumbar region     History reviewed. No pertinent surgical history.  Family History  Problem Relation Age of Onset  . COPD Mother   . Heart attack Father   . Colon cancer Neg Hx     SOCIAL HISTORY: Social History   Socioeconomic History  . Marital status: Married    Spouse name: Not on file  . Number of children: Not on file  . Years of education: Not on file  . Highest education level: Not on file  Occupational History  . Not on file  Tobacco Use  . Smoking status: Never Smoker  . Smokeless tobacco: Never Used  Substance and Sexual Activity  . Alcohol use: No    Alcohol/week: 0.0 standard drinks  . Drug use: No  . Sexual activity: Not on file  Other Topics Concern  . Not on file  Social History Narrative  . Not on file   Social Determinants of Health   Financial Resource  Strain: Not on file  Food Insecurity: Not on file  Transportation Needs: Not on file  Physical Activity: Not on file  Stress: Not on file  Social Connections: Not on file  Intimate Partner Violence: Not on file    Allergies  Allergen Reactions  . Metronidazole     REACTION: hives    Current Outpatient Medications  Medication Sig Dispense Refill  . amLODipine (NORVASC) 5 MG tablet Take 5 mg by mouth daily.    Marland Kitchen levothyroxine (SYNTHROID) 100 MCG tablet TAKE 1 TABLET BY MOUTH EVERY DAY 90 tablet 0  . meloxicam (MOBIC) 15 MG tablet TAKE 1 TABLET BY MOUTH EVERY DAY 90 tablet 1  . rosuvastatin (CRESTOR) 5 MG tablet TAKE 1 TABLET BY MOUTH AT NIGHT 90 tablet 1  . Venlafaxine HCl 225 MG TB24 Take 1 tablet by mouth daily.    Marland Kitchen venlafaxine XR (EFFEXOR-XR) 75 MG 24 hr capsule Take 225 mg by mouth daily.     No current facility-administered medications for this visit.    REVIEW OF SYSTEMS:  [X]  denotes positive finding, [ ]  denotes negative finding Cardiac  Comments:  Chest pain or chest pressure:    Shortness of breath upon exertion:    Short of breath when lying flat:    Irregular heart rhythm:  Vascular    Pain in calf, thigh, or hip brought on by ambulation:    Pain in feet at night that wakes you up from your sleep:     Blood clot in your veins:    Leg swelling:         Pulmonary    Oxygen at home:    Productive cough:     Wheezing:         Neurologic    Sudden weakness in arms or legs:     Sudden numbness in arms or legs:     Sudden onset of difficulty speaking or slurred speech:    Temporary loss of vision in one eye:     Problems with dizziness:         Gastrointestinal    Blood in stool:     Vomited blood:         Genitourinary    Burning when urinating:     Blood in urine:        Psychiatric    Major depression:         Hematologic    Bleeding problems:    Problems with blood clotting too easily:        Skin    Rashes or ulcers:         Constitutional    Fever or chills:      PHYSICAL EXAM: Vitals:   12/06/20 1132  BP: (!) 143/78  Pulse: 73  Resp: 16  Temp: 98.4 F (36.9 C)  TempSrc: Temporal  SpO2: 95%  Weight: 204 lb (92.5 kg)  Height: 5\' 6"  (1.676 m)    GENERAL: The patient is a well-nourished female, in no acute distress. The vital signs are documented above. CARDIAC: There is a regular rate and rhythm.  VASCULAR:  Palpable femoral pulses bilaterally Palpable dorsalis pedis pulses bilaterally PULMONARY: No respiratory distress. ABDOMEN: Soft and non-tender. MUSCULOSKELETAL: There are no major deformities or cyanosis. NEUROLOGIC: No focal weakness or paresthesias are detected. SKIN: There are no ulcers or rashes noted. PSYCHIATRIC: The patient has a normal affect.  DATA:   Reviewed her CT L-spine from July 29, 2020 and her MRI L spine from July 27th, 2021  Assessment/Plan:  59 year old female with chronic lower back pain that presents for preop evaluation of planned L5-S1 ALIF with Dr. 77.  He has scheduled her for a staged approach over multiple days in the OR.  Vascular surgery has been asked to assist with the L5-S1 ALIF portion with abdominal approach.  I have reviewed her CT L-spine and MRI from earlier this year and she seems like she would be a good candidate for anterior approach.  I talked about a transverse incision over the left rectus muscle with mobilization of the left rectus and then entering the retroperitoneal space laterally and mobilizing peritoneum and ureter across midline as well as mobilizing iliac artery and vein for exposure of the L5-S1 disc space.  We talked about risk and benefits including injury to the above structures.  Look forward to assisting Dr. Wynetta Emery, MD [facility]: [phone]

## 2020-12-07 DIAGNOSIS — E039 Hypothyroidism, unspecified: Secondary | ICD-10-CM | POA: Diagnosis not present

## 2020-12-07 DIAGNOSIS — I10 Essential (primary) hypertension: Secondary | ICD-10-CM | POA: Diagnosis not present

## 2020-12-07 DIAGNOSIS — F3341 Major depressive disorder, recurrent, in partial remission: Secondary | ICD-10-CM | POA: Diagnosis not present

## 2020-12-07 DIAGNOSIS — E785 Hyperlipidemia, unspecified: Secondary | ICD-10-CM | POA: Diagnosis not present

## 2020-12-07 DIAGNOSIS — M159 Polyosteoarthritis, unspecified: Secondary | ICD-10-CM | POA: Diagnosis not present

## 2020-12-15 ENCOUNTER — Other Ambulatory Visit: Payer: Self-pay | Admitting: Family Medicine

## 2020-12-15 NOTE — Progress Notes (Signed)
[id number] - [place], [place] - [address] [facility] [address] Phone: [phone] Fax: [phone]      Your procedure is scheduled on 12/21/2020.  Report to [facility] Main Entrance "A" at 06:30 A.M., and check in at the Admitting office.  Call this number if you have problems the morning of surgery:  [phone]  Call [phone] if you have any questions prior to your surgery date Monday-Friday 8am-4pm    Remember:  Do not eat or drink after midnight the night before your surgery    Take these medicines the morning of surgery with A SIP OF WATER    amLODipine (NORVASC)    levothyroxine (SYNTHROID)   Venlafaxine HCl   As of today, STOP taking any Aspirin (unless otherwise instructed by your surgeon) Aleve, Naproxen, Ibuprofen, Motrin, Advil, Goody's, BC's, all herbal medications, fish oil, and all vitamins.                      Do not wear jewelry, make up, or nail polish            Do not wear lotions, powders, perfumes/colognes, or deodorant.            Do not shave 48 hours prior to surgery.  Men may shave face and neck.            Do not bring valuables to the hospital.            [facility] is not responsible for any belongings or valuables.  Do NOT Smoke (Tobacco/Vaping) or drink Alcohol 24 hours prior to your procedure If you use a CPAP at night, you may bring all equipment for your overnight stay.   Contacts, glasses, dentures or bridgework may not be worn into surgery.      For patients admitted to the hospital, discharge time will be determined by your treatment team.   Patients discharged the day of surgery will not be allowed to drive home, and someone needs to stay with them for 24 hours.    Special instructions:   [place]- Preparing For Surgery  Before surgery, you can play an important role. Because skin is not sterile, your skin needs to be as free of germs as possible. You can reduce the number of germs on your  skin by washing with CHG (chlorahexidine gluconate) Soap before surgery.  CHG is an antiseptic cleaner which kills germs and bonds with the skin to continue killing germs even after washing.    Oral Hygiene is also important to reduce your risk of infection.  Remember - BRUSH YOUR TEETH THE MORNING OF SURGERY WITH YOUR REGULAR TOOTHPASTE  Please do not use if you have an allergy to CHG or antibacterial soaps. If your skin becomes reddened/irritated stop using the CHG.  Do not shave (including legs and underarms) for at least 48 hours prior to first CHG shower. It is OK to shave your face.  Please follow these instructions carefully.   1. Shower the NIGHT BEFORE SURGERY and the MORNING OF SURGERY with CHG Soap.   2. If you chose to wash your hair, wash your hair first as usual with your normal shampoo.  3. After you shampoo, rinse your hair and body thoroughly to remove the shampoo.  4. Use CHG as you would any other liquid soap. You can apply CHG directly to the skin and wash gently with a scrungie or a clean washcloth.  5. Apply the CHG Soap to your body ONLY FROM THE NECK DOWN.  Do not use on open wounds or open sores. Avoid contact with your eyes, ears, mouth and genitals (private parts). Wash Face and genitals (private parts)  with your normal soap.   6. Wash thoroughly, paying special attention to the area where your surgery will be performed.  7. Thoroughly rinse your body with warm water from the neck down.  8. DO NOT shower/wash with your normal soap after using and rinsing off the CHG Soap.  9. Pat yourself dry with a CLEAN TOWEL.  10. Wear CLEAN PAJAMAS to bed the night before surgery  11. Place CLEAN SHEETS on your bed the night of your first shower and DO NOT SLEEP WITH PETS.   Day of Surgery: Wear Clean/Comfortable clothing the morning of surgery Do not apply any deodorants/lotions.   Remember to brush your teeth WITH YOUR REGULAR TOOTHPASTE.   Please read over the  following fact sheets that you were given.

## 2020-12-19 ENCOUNTER — Other Ambulatory Visit: Payer: Self-pay

## 2020-12-19 ENCOUNTER — Other Ambulatory Visit (HOSPITAL_COMMUNITY)
Admission: RE | Admit: 2020-12-19 | Discharge: 2020-12-19 | Disposition: A | Discharge status: Home / Self Care | Payer: BC Managed Care – PPO | Source: Ambulatory Visit | Attending: Neurosurgery | Admitting: Neurosurgery

## 2020-12-19 ENCOUNTER — Encounter (HOSPITAL_COMMUNITY): Payer: Self-pay

## 2020-12-19 ENCOUNTER — Encounter (HOSPITAL_COMMUNITY)
Admission: RE | Admit: 2020-12-19 | Discharge: 2020-12-19 | Disposition: A | Discharge status: Home / Self Care | Payer: BC Managed Care – PPO | Source: Ambulatory Visit | Attending: Neurosurgery | Admitting: Neurosurgery

## 2020-12-19 DIAGNOSIS — Z20822 Contact with and (suspected) exposure to covid-19: Secondary | ICD-10-CM | POA: Diagnosis not present

## 2020-12-19 DIAGNOSIS — Z01818 Encounter for other preprocedural examination: Secondary | ICD-10-CM | POA: Diagnosis not present

## 2020-12-19 DIAGNOSIS — Z01812 Encounter for preprocedural laboratory examination: Secondary | ICD-10-CM | POA: Insufficient documentation

## 2020-12-19 DIAGNOSIS — E785 Hyperlipidemia, unspecified: Secondary | ICD-10-CM | POA: Diagnosis not present

## 2020-12-19 DIAGNOSIS — F988 Other specified behavioral and emotional disorders with onset usually occurring in childhood and adolescence: Secondary | ICD-10-CM | POA: Diagnosis not present

## 2020-12-19 DIAGNOSIS — I10 Essential (primary) hypertension: Secondary | ICD-10-CM | POA: Diagnosis not present

## 2020-12-19 DIAGNOSIS — M159 Polyosteoarthritis, unspecified: Secondary | ICD-10-CM | POA: Diagnosis not present

## 2020-12-19 LAB — TYPE AND SCREEN
ABO/RH(D): A POS
Antibody Screen: NEGATIVE

## 2020-12-19 LAB — BASIC METABOLIC PANEL
Anion gap: 10 (ref 5–15)
BUN: 14 mg/dL (ref 8–23)
CO2: 26 mmol/L (ref 22–32)
Calcium: 8.8 mg/dL — ABNORMAL LOW (ref 8.9–10.3)
Chloride: 102 mmol/L (ref 98–111)
Creatinine, Ser: 0.71 mg/dL (ref 0.44–1.00)
GFR, Estimated: >60 mL/min (ref >60)
Glucose, Bld: 96 mg/dL (ref 70–99)
Potassium: 3.5 mmol/L (ref 3.5–5.1)
Sodium: 138 mmol/L (ref 135–145)

## 2020-12-19 LAB — SURGICAL PCR SCREEN
MRSA, PCR: NEGATIVE
Staphylococcus aureus: NEGATIVE

## 2020-12-19 LAB — CBC
HCT: 39.3 % (ref 36.0–46.0)
Hemoglobin: 12.7 g/dL (ref 12.0–15.0)
MCH: 30.5 pg (ref 26.0–34.0)
MCHC: 32.3 g/dL (ref 30.0–36.0)
MCV: 94.2 fL (ref 80.0–100.0)
Platelets: 277 10*3/uL (ref 150–400)
RBC: 4.17 MIL/uL (ref 3.87–5.11)
RDW: 14.2 % (ref 11.5–15.5)
WBC: 7.4 10*3/uL (ref 4.0–10.5)
nRBC: 0 % (ref 0.0–0.2)

## 2020-12-19 NOTE — Progress Notes (Addendum)
PCP - Dr. Simone Curia Cardiologist - denies  Chest x-ray - N/A EKG - 12/19/20 Stress Test - denies  ECHO - denies Cardiac Cath - denies  Sleep Study - has had sleep study, no h/o OSA CPAP - does not use  Aspirin Instructions: Patient instructed to hold all Aspirin, NSAID's, herbal medications, fish oil and vitamins as of today  Covid test: 12/20/19 at at [facility]. Pt instructed to remain in their car. Educated on Haematologist until [employer]    Anesthesia review:   Patient denies shortness of breath, fever, cough and chest pain at PAT appointment   Patient verbalized understanding of instructions that were given to them at the PAT appointment. Patient was also instructed that they will need to review over the PAT instructions again at home before surgery.

## 2020-12-20 ENCOUNTER — Encounter (HOSPITAL_COMMUNITY): Payer: Self-pay | Admitting: Emergency Medicine

## 2020-12-20 LAB — SARS CORONAVIRUS 2 (TAT 6-24 HRS): SARS Coronavirus 2: NEGATIVE

## 2020-12-21 ENCOUNTER — Inpatient Hospital Stay (HOSPITAL_COMMUNITY): Admission: RE | Admit: 2020-12-21 | Payer: BC Managed Care – PPO | Source: Home / Self Care | Admitting: Neurosurgery

## 2020-12-21 SURGERY — ANTERIOR LUMBAR FUSION 1 LEVEL
Anesthesia: General

## 2020-12-23 ENCOUNTER — Inpatient Hospital Stay (HOSPITAL_COMMUNITY): Admission: RE | Admit: 2020-12-23 | Payer: BC Managed Care – PPO | Source: Home / Self Care | Admitting: Neurosurgery

## 2020-12-23 ENCOUNTER — Encounter (HOSPITAL_COMMUNITY): Admission: RE | Payer: Self-pay | Source: Home / Self Care

## 2020-12-23 SURGERY — POSTERIOR LUMBAR FUSION 1 LEVEL
Anesthesia: General | Site: Back

## 2020-12-30 ENCOUNTER — Other Ambulatory Visit: Payer: Self-pay | Admitting: Neurosurgery

## 2021-01-25 NOTE — Progress Notes (Signed)
Surgical Instructions    Your procedure is scheduled on Monday February 14th.  Report to [facility] Main Entrance "A" at 5:30 A.M., then check in with the Admitting office.  Call this number if you have problems the morning of surgery:  [phone]   If you have any questions prior to your surgery date call [phone]: Open Monday-Friday 8am-4pm    Remember:  Do not eat or drink anything after midnight the night before your surgery     Take these medicines the morning of surgery with A SIP OF WATER   amLODipine (NORVASC) 5 MG tablet  levothyroxine (SYNTHROID) 100 MCG tablet  rosuvastatin (CRESTOR) 5 MG tablet  Venlafaxine HCl 225 MG TB24  IF NEEDED  acetaminophen (TYLENOL) 650 MG CR tablet   As of today, STOP taking MOBIC and any Aspirin (unless otherwise instructed by your surgeon) Aleve, Naproxen, Ibuprofen, Motrin, Advil, Goody's, BC's, all herbal medications, fish oil, and all vitamins.                     Do not wear jewelry, make up, or nail polish            Do not wear lotions, powders, perfumes, or deodorant.            Do not shave 48 hours prior to surgery.              Do not bring valuables to the hospital.            [name] is not responsible for any belongings or valuables.  Do NOT Smoke (Tobacco/Vaping) or drink Alcohol 24 hours prior to your procedure If you use a CPAP at night, you may bring all equipment for your overnight stay.   Contacts, glasses, dentures or bridgework may not be worn into surgery, please bring cases for these belongings   For patients admitted to the hospital, discharge time will be determined by your treatment team.   Patients discharged the day of surgery will not be allowed to drive home, and someone needs to stay with them for 24 hours.    Special instructions:   [name]  Before surgery, you can play an important role. Because skin is not sterile, your skin needs to be as free of germs as  possible. You can reduce the number of germs on your skin by washing with CHG (chlorahexidine gluconate) Soap before surgery.  CHG is an antiseptic cleaner which kills germs and bonds with the skin to continue killing germs even after washing.    Oral Hygiene is also important to reduce your risk of infection.  Remember - BRUSH YOUR TEETH THE MORNING OF SURGERY WITH YOUR REGULAR TOOTHPASTE  Please do not use if you have an allergy to CHG or antibacterial soaps. If your skin becomes reddened/irritated stop using the CHG.  Do not shave (including legs and underarms) for at least 48 hours prior to first CHG shower. It is OK to shave your face.  Please follow these instructions carefully.   1. Shower the NIGHT BEFORE SURGERY and the MORNING OF SURGERY  2. If you chose to wash your hair, wash your hair first as usual with your normal shampoo.  3. After you shampoo, rinse your hair and body thoroughly to remove the shampoo.  4. Wash Face and genitals (private parts) with your normal soap.   5.  Shower the NIGHT BEFORE SURGERY and the MORNING OF SURGERY with CHG Soap.  6. Use CHG Soap as you would any other liquid soap. You can apply CHG directly to the skin and wash gently with a scrungie or a clean washcloth.   7. Apply the CHG Soap to your body ONLY FROM THE NECK DOWN.  Do not use on open wounds or open sores. Avoid contact with your eyes, ears, mouth and genitals (private parts). Wash Face and genitals (private parts)  with your normal soap.   8. Wash thoroughly, paying special attention to the area where your surgery will be performed.  9. Thoroughly rinse your body with warm water from the neck down.  10. DO NOT shower/wash with your normal soap after using and rinsing off the CHG Soap.  11. Pat yourself dry with a CLEAN TOWEL.  12. Wear CLEAN PAJAMAS to bed the night before surgery  13. Place CLEAN SHEETS on your bed the night before your surgery  14. DO NOT SLEEP WITH  PETS.   Day of Surgery: Wear Clean/Comfortable clothing the morning of surgery Do not apply any deodorants/lotions.   Remember to brush your teeth WITH YOUR REGULAR TOOTHPASTE.   Please read over the following fact sheets that you were given.

## 2021-01-26 ENCOUNTER — Other Ambulatory Visit (HOSPITAL_COMMUNITY)
Admission: RE | Admit: 2021-01-26 | Discharge: 2021-01-26 | Disposition: A | Discharge status: Home / Self Care | Payer: BC Managed Care – PPO | Source: Ambulatory Visit | Attending: Neurosurgery | Admitting: Neurosurgery

## 2021-01-26 ENCOUNTER — Encounter (HOSPITAL_COMMUNITY)
Admission: RE | Admit: 2021-01-26 | Discharge: 2021-01-26 | Disposition: A | Discharge status: Home / Self Care | Payer: BC Managed Care – PPO | Source: Ambulatory Visit | Attending: Neurosurgery | Admitting: Neurosurgery

## 2021-01-26 ENCOUNTER — Encounter (HOSPITAL_COMMUNITY): Payer: Self-pay

## 2021-01-26 ENCOUNTER — Other Ambulatory Visit: Payer: Self-pay

## 2021-01-26 DIAGNOSIS — Z01812 Encounter for preprocedural laboratory examination: Secondary | ICD-10-CM | POA: Insufficient documentation

## 2021-01-26 DIAGNOSIS — Z20822 Contact with and (suspected) exposure to covid-19: Secondary | ICD-10-CM | POA: Insufficient documentation

## 2021-01-26 LAB — CBC
HCT: 42.5 % (ref 36.0–46.0)
Hemoglobin: 13.6 g/dL (ref 12.0–15.0)
MCH: 30.5 pg (ref 26.0–34.0)
MCHC: 32 g/dL (ref 30.0–36.0)
MCV: 95.3 fL (ref 80.0–100.0)
Platelets: 263 10*3/uL (ref 150–400)
RBC: 4.46 MIL/uL (ref 3.87–5.11)
RDW: 13.7 % (ref 11.5–15.5)
WBC: 8.5 10*3/uL (ref 4.0–10.5)
nRBC: 0 % (ref 0.0–0.2)

## 2021-01-26 LAB — SURGICAL PCR SCREEN
MRSA, PCR: NEGATIVE
Staphylococcus aureus: NEGATIVE

## 2021-01-26 LAB — BASIC METABOLIC PANEL
Anion gap: 10 (ref 5–15)
BUN: 9 mg/dL (ref 8–23)
CO2: 25 mmol/L (ref 22–32)
Calcium: 8.8 mg/dL — ABNORMAL LOW (ref 8.9–10.3)
Chloride: 105 mmol/L (ref 98–111)
Creatinine, Ser: 0.78 mg/dL (ref 0.44–1.00)
GFR, Estimated: >60 mL/min (ref >60)
Glucose, Bld: 88 mg/dL (ref 70–99)
Potassium: 4.1 mmol/L (ref 3.5–5.1)
Sodium: 140 mmol/L (ref 135–145)

## 2021-01-26 LAB — SARS CORONAVIRUS 2 (TAT 6-24 HRS): SARS Coronavirus 2: NEGATIVE

## 2021-01-26 NOTE — Progress Notes (Signed)
PCP - Dr. Simone Curia Cardiologist -   PPM/ICD - Denies  Chest x-ray - N/A EKG - 12/19/20 Stress Test - Denies ECHO - Denies Cardiac Cath - Denies  Sleep Study - Denies  Patient denies having diabetes.  Blood Thinner Instructions: N/A Aspirin Instructions: N/A  ERAS Protcol - No  COVID TEST- 01/26/21   Anesthesia review: No  Patient denies shortness of breath, fever, cough and chest pain at PAT appointment   All instructions explained to the patient, with a verbal understanding of the material. Patient agrees to go over the instructions while at home for a better understanding. Patient also instructed to self quarantine after being tested for COVID-19. The opportunity to ask questions was provided.

## 2021-01-30 ENCOUNTER — Other Ambulatory Visit: Payer: Self-pay

## 2021-01-30 ENCOUNTER — Inpatient Hospital Stay (HOSPITAL_COMMUNITY)
Admission: RE | Admit: 2021-01-30 | Discharge: 2021-02-04 | DRG: 454 | Disposition: A | Discharge status: Home / Self Care | Payer: BC Managed Care – PPO | Attending: Neurosurgery | Admitting: Neurosurgery

## 2021-01-30 ENCOUNTER — Inpatient Hospital Stay (HOSPITAL_COMMUNITY): Payer: BC Managed Care – PPO

## 2021-01-30 ENCOUNTER — Encounter (HOSPITAL_COMMUNITY): Payer: Self-pay | Admitting: Neurosurgery

## 2021-01-30 ENCOUNTER — Encounter (HOSPITAL_COMMUNITY)
Admission: RE | Disposition: A | Discharge status: Home / Self Care | Payer: Self-pay | Source: Home / Self Care | Attending: Neurosurgery

## 2021-01-30 ENCOUNTER — Inpatient Hospital Stay (HOSPITAL_COMMUNITY): Payer: BC Managed Care – PPO | Admitting: Physician Assistant

## 2021-01-30 DIAGNOSIS — M5116 Intervertebral disc disorders with radiculopathy, lumbar region: Secondary | ICD-10-CM | POA: Diagnosis present

## 2021-01-30 DIAGNOSIS — G8929 Other chronic pain: Secondary | ICD-10-CM | POA: Diagnosis not present

## 2021-01-30 DIAGNOSIS — M48062 Spinal stenosis, lumbar region with neurogenic claudication: Secondary | ICD-10-CM | POA: Diagnosis not present

## 2021-01-30 DIAGNOSIS — Z981 Arthrodesis status: Secondary | ICD-10-CM | POA: Diagnosis not present

## 2021-01-30 DIAGNOSIS — M4317 Spondylolisthesis, lumbosacral region: Secondary | ICD-10-CM | POA: Diagnosis present

## 2021-01-30 DIAGNOSIS — D62 Acute posthemorrhagic anemia: Secondary | ICD-10-CM | POA: Diagnosis not present

## 2021-01-30 DIAGNOSIS — M4186 Other forms of scoliosis, lumbar region: Secondary | ICD-10-CM | POA: Diagnosis not present

## 2021-01-30 DIAGNOSIS — Z791 Long term (current) use of non-steroidal anti-inflammatories (NSAID): Secondary | ICD-10-CM

## 2021-01-30 DIAGNOSIS — E785 Hyperlipidemia, unspecified: Secondary | ICD-10-CM | POA: Diagnosis present

## 2021-01-30 DIAGNOSIS — M4127 Other idiopathic scoliosis, lumbosacral region: Secondary | ICD-10-CM | POA: Diagnosis not present

## 2021-01-30 DIAGNOSIS — Z881 Allergy status to other antibiotic agents status: Secondary | ICD-10-CM

## 2021-01-30 DIAGNOSIS — F32A Depression, unspecified: Secondary | ICD-10-CM | POA: Diagnosis present

## 2021-01-30 DIAGNOSIS — M4316 Spondylolisthesis, lumbar region: Secondary | ICD-10-CM | POA: Diagnosis not present

## 2021-01-30 DIAGNOSIS — E039 Hypothyroidism, unspecified: Secondary | ICD-10-CM | POA: Diagnosis not present

## 2021-01-30 DIAGNOSIS — Z419 Encounter for procedure for purposes other than remedying health state, unspecified: Secondary | ICD-10-CM

## 2021-01-30 DIAGNOSIS — Z79890 Hormone replacement therapy (postmenopausal): Secondary | ICD-10-CM

## 2021-01-30 DIAGNOSIS — M5136 Other intervertebral disc degeneration, lumbar region: Secondary | ICD-10-CM | POA: Diagnosis not present

## 2021-01-30 DIAGNOSIS — Z79899 Other long term (current) drug therapy: Secondary | ICD-10-CM | POA: Diagnosis not present

## 2021-01-30 DIAGNOSIS — I10 Essential (primary) hypertension: Secondary | ICD-10-CM | POA: Diagnosis not present

## 2021-01-30 DIAGNOSIS — Z9889 Other specified postprocedural states: Secondary | ICD-10-CM | POA: Diagnosis not present

## 2021-01-30 DIAGNOSIS — M48061 Spinal stenosis, lumbar region without neurogenic claudication: Secondary | ICD-10-CM | POA: Diagnosis not present

## 2021-01-30 DIAGNOSIS — M5137 Other intervertebral disc degeneration, lumbosacral region: Secondary | ICD-10-CM | POA: Diagnosis not present

## 2021-01-30 DIAGNOSIS — M4187 Other forms of scoliosis, lumbosacral region: Secondary | ICD-10-CM | POA: Diagnosis not present

## 2021-01-30 DIAGNOSIS — M4326 Fusion of spine, lumbar region: Secondary | ICD-10-CM | POA: Diagnosis not present

## 2021-01-30 HISTORY — PX: ANTERIOR LAT LUMBAR FUSION: SHX1168

## 2021-01-30 HISTORY — PX: ANTERIOR LUMBAR FUSION: SHX1170

## 2021-01-30 HISTORY — PX: ABDOMINAL EXPOSURE: SHX5708

## 2021-01-30 LAB — POCT I-STAT 7, (LYTES, BLD GAS, ICA,H+H)
Acid-base deficit: 2 mmol/L (ref 0.0–2.0)
Acid-base deficit: 2 mmol/L (ref 0.0–2.0)
Bicarbonate: 22.2 mmol/L (ref 20.0–28.0)
Bicarbonate: 24.2 mmol/L (ref 20.0–28.0)
Calcium, Ion: 1.12 mmol/L — ABNORMAL LOW (ref 1.15–1.40)
Calcium, Ion: 1.13 mmol/L — ABNORMAL LOW (ref 1.15–1.40)
HCT: 30 % — ABNORMAL LOW (ref 36.0–46.0)
HCT: 32 % — ABNORMAL LOW (ref 36.0–46.0)
Hemoglobin: 10.2 g/dL — ABNORMAL LOW (ref 12.0–15.0)
Hemoglobin: 10.9 g/dL — ABNORMAL LOW (ref 12.0–15.0)
O2 Saturation: 99 %
O2 Saturation: 98 %
Patient temperature: 35.5
Patient temperature: 35.3
Potassium: 3 mmol/L — ABNORMAL LOW (ref 3.5–5.1)
Potassium: 3.4 mmol/L — ABNORMAL LOW (ref 3.5–5.1)
Sodium: 140 mmol/L (ref 135–145)
Sodium: 139 mmol/L (ref 135–145)
TCO2: 23 mmol/L (ref 22–32)
TCO2: 25 mmol/L (ref 22–32)
pCO2 arterial: 34.6 mmHg (ref 32.0–48.0)
pCO2 arterial: 40.8 mmHg (ref 32.0–48.0)
pH, Arterial: 7.409 (ref 7.350–7.450)
pH, Arterial: 7.373 (ref 7.350–7.450)
pO2, Arterial: 110 mmHg — ABNORMAL HIGH (ref 83.0–108.0)
pO2, Arterial: 94 mmHg (ref 83.0–108.0)

## 2021-01-30 SURGERY — ANTERIOR LUMBAR FUSION 1 LEVEL
Anesthesia: General | Site: Spine Lumbar

## 2021-01-30 MED ORDER — SODIUM CHLORIDE 0.9 % IV SOLN: 250.0000 mL | INTRAVENOUS | Status: DC

## 2021-01-30 MED ORDER — LACTATED RINGERS IV SOLN: INTRAVENOUS | Status: DC

## 2021-01-30 MED ORDER — SODIUM CHLORIDE 0.9% FLUSH
3.0000 mL | Freq: Two times a day (BID) | INTRAVENOUS | Status: DC
  Administered 2021-01-30 – 2021-02-03 (×3): 3 mL via INTRAVENOUS

## 2021-01-30 MED ORDER — VENLAFAXINE HCL ER 225 MG PO TB24: 225.0000 mg | ORAL_TABLET | Freq: Every day | ORAL | Status: DC

## 2021-01-30 MED ORDER — CHLORHEXIDINE GLUCONATE CLOTH 2 % EX PADS: 6.0000 | MEDICATED_PAD | Freq: Once | CUTANEOUS | Status: DC

## 2021-01-30 MED ORDER — PHENOL 1.4 % MT LIQD: 1.0000 | OROMUCOSAL | Status: DC | PRN

## 2021-01-30 MED ORDER — ACETAMINOPHEN 160 MG/5ML PO SOLN: 325.0000 mg | Freq: Once | ORAL | Status: DC | PRN

## 2021-01-30 MED ORDER — THROMBIN 5000 UNITS EX SOLR
CUTANEOUS | Status: AC
  Filled 2021-01-30: qty 5000

## 2021-01-30 MED ORDER — ORAL CARE MOUTH RINSE: 15.0000 mL | Freq: Once | OROMUCOSAL | Status: AC

## 2021-01-30 MED ORDER — PROPOFOL 1000 MG/100ML IV EMUL
INTRAVENOUS | Status: AC
  Filled 2021-01-30: qty 100

## 2021-01-30 MED ORDER — LEVOTHYROXINE SODIUM 100 MCG PO TABS
100.0000 ug | ORAL_TABLET | Freq: Every day | ORAL | Status: DC
  Administered 2021-01-31 – 2021-02-04 (×5): 100 ug via ORAL
  Filled 2021-01-30 (×5): qty 1

## 2021-01-30 MED ORDER — FENTANYL CITRATE (PF) 250 MCG/5ML IJ SOLN
INTRAMUSCULAR | Status: AC
  Filled 2021-01-30: qty 5

## 2021-01-30 MED ORDER — PROPOFOL 10 MG/ML IV BOLUS
INTRAVENOUS | Status: AC
  Filled 2021-01-30: qty 40

## 2021-01-30 MED ORDER — ACETAMINOPHEN 650 MG RE SUPP: 650.0000 mg | RECTAL | Status: DC | PRN

## 2021-01-30 MED ORDER — HYDROMORPHONE HCL 1 MG/ML IJ SOLN
INTRAMUSCULAR | Status: AC
  Administered 2021-01-30: 0.5 mg via INTRAVENOUS
  Filled 2021-01-30: qty 1

## 2021-01-30 MED ORDER — ACETAMINOPHEN 10 MG/ML IV SOLN: 1000.0000 mg | Freq: Once | INTRAVENOUS | Status: DC | PRN

## 2021-01-30 MED ORDER — LIDOCAINE 2% (20 MG/ML) 5 ML SYRINGE
INTRAMUSCULAR | Status: AC
  Filled 2021-01-30: qty 5

## 2021-01-30 MED ORDER — CEFAZOLIN SODIUM-DEXTROSE 2-4 GM/100ML-% IV SOLN
2.0000 g | Freq: Three times a day (TID) | INTRAVENOUS | Status: AC
  Administered 2021-01-30 – 2021-01-31 (×2): 2 g via INTRAVENOUS
  Filled 2021-01-30 (×2): qty 100

## 2021-01-30 MED ORDER — PHENYLEPHRINE HCL-NACL 10-0.9 MG/250ML-% IV SOLN
INTRAVENOUS | Status: DC | PRN
  Administered 2021-01-30: 25 ug/min via INTRAVENOUS

## 2021-01-30 MED ORDER — AMLODIPINE BESYLATE 5 MG PO TABS: 5.0000 mg | ORAL_TABLET | Freq: Every day | ORAL | Status: DC

## 2021-01-30 MED ORDER — MIDAZOLAM HCL 5 MG/5ML IJ SOLN
INTRAMUSCULAR | Status: DC | PRN
  Administered 2021-01-30: 2 mg via INTRAVENOUS

## 2021-01-30 MED ORDER — ONDANSETRON HCL 4 MG/2ML IJ SOLN: 4.0000 mg | Freq: Four times a day (QID) | INTRAMUSCULAR | Status: DC | PRN

## 2021-01-30 MED ORDER — MIDAZOLAM HCL 2 MG/2ML IJ SOLN
INTRAMUSCULAR | Status: AC
  Filled 2021-01-30: qty 2

## 2021-01-30 MED ORDER — HYDROMORPHONE HCL 1 MG/ML IJ SOLN
0.2500 mg | INTRAMUSCULAR | Status: DC | PRN
Start: 2021-01-30 — End: 2021-01-30
  Administered 2021-01-30: 0.5 mg via INTRAVENOUS

## 2021-01-30 MED ORDER — ONDANSETRON HCL 4 MG/2ML IJ SOLN
INTRAMUSCULAR | Status: AC
  Filled 2021-01-30: qty 2

## 2021-01-30 MED ORDER — THROMBIN 20000 UNITS EX SOLR
CUTANEOUS | Status: AC
  Filled 2021-01-30: qty 20000

## 2021-01-30 MED ORDER — CHLORHEXIDINE GLUCONATE CLOTH 2 % EX PADS
6.0000 | MEDICATED_PAD | Freq: Every day | CUTANEOUS | Status: DC
  Administered 2021-01-30: 6 via TOPICAL

## 2021-01-30 MED ORDER — METHYLPHENIDATE HCL ER (LA) 10 MG PO CP24: 10.0000 mg | ORAL_CAPSULE | Freq: Every day | ORAL | Status: DC | PRN

## 2021-01-30 MED ORDER — LACTATED RINGERS IV SOLN: INTRAVENOUS | Status: DC | PRN

## 2021-01-30 MED ORDER — OXYCODONE HCL 5 MG PO TABS
10.0000 mg | ORAL_TABLET | ORAL | Status: DC | PRN
  Administered 2021-01-30 – 2021-02-02 (×10): 10 mg via ORAL
  Administered 2021-02-02: 5 mg via ORAL
  Administered 2021-02-02 – 2021-02-03 (×5): 10 mg via ORAL
  Filled 2021-01-30 (×18): qty 2

## 2021-01-30 MED ORDER — ROSUVASTATIN CALCIUM 5 MG PO TABS
5.0000 mg | ORAL_TABLET | Freq: Every day | ORAL | Status: DC
  Administered 2021-01-31 – 2021-02-04 (×4): 5 mg via ORAL
  Filled 2021-01-30 (×4): qty 1

## 2021-01-30 MED ORDER — SUGAMMADEX SODIUM 200 MG/2ML IV SOLN
INTRAVENOUS | Status: DC | PRN
  Administered 2021-01-30 (×2): 50 mg via INTRAVENOUS

## 2021-01-30 MED ORDER — ACETAMINOPHEN 325 MG PO TABS: 325.0000 mg | ORAL_TABLET | Freq: Once | ORAL | Status: DC | PRN

## 2021-01-30 MED ORDER — CHLORHEXIDINE GLUCONATE 0.12 % MT SOLN
15.0000 mL | Freq: Once | OROMUCOSAL | Status: AC
  Administered 2021-01-30: 15 mL via OROMUCOSAL
  Filled 2021-01-30: qty 15

## 2021-01-30 MED ORDER — AMISULPRIDE (ANTIEMETIC) 5 MG/2ML IV SOLN: 10.0000 mg | Freq: Once | INTRAVENOUS | Status: DC | PRN

## 2021-01-30 MED ORDER — VENLAFAXINE HCL ER 75 MG PO CP24
225.0000 mg | ORAL_CAPSULE | Freq: Every day | ORAL | Status: DC
  Administered 2021-01-31 – 2021-02-04 (×4): 225 mg via ORAL
  Filled 2021-01-30 (×4): qty 3

## 2021-01-30 MED ORDER — ALUM & MAG HYDROXIDE-SIMETH 200-200-20 MG/5ML PO SUSP: 30.0000 mL | Freq: Four times a day (QID) | ORAL | Status: DC | PRN

## 2021-01-30 MED ORDER — SODIUM CHLORIDE 0.9% FLUSH: 3.0000 mL | INTRAVENOUS | Status: DC | PRN

## 2021-01-30 MED ORDER — IBUPROFEN 200 MG PO TABS: 400.0000 mg | ORAL_TABLET | Freq: Four times a day (QID) | ORAL | Status: DC | PRN

## 2021-01-30 MED ORDER — ARTIFICIAL TEARS OPHTHALMIC OINT
TOPICAL_OINTMENT | OPHTHALMIC | Status: AC
  Filled 2021-01-30: qty 3.5

## 2021-01-30 MED ORDER — MEPERIDINE HCL 25 MG/ML IJ SOLN
6.2500 mg | INTRAMUSCULAR | Status: DC | PRN
Start: 2021-01-30 — End: 2021-01-30

## 2021-01-30 MED ORDER — ROCURONIUM BROMIDE 10 MG/ML (PF) SYRINGE
PREFILLED_SYRINGE | INTRAVENOUS | Status: DC | PRN
  Administered 2021-01-30 (×2): 40 mg via INTRAVENOUS
  Administered 2021-01-30: 20 mg via INTRAVENOUS

## 2021-01-30 MED ORDER — KETAMINE HCL 50 MG/5ML IJ SOSY
PREFILLED_SYRINGE | INTRAMUSCULAR | Status: AC
  Filled 2021-01-30: qty 10

## 2021-01-30 MED ORDER — ACETAMINOPHEN 325 MG PO TABS
650.0000 mg | ORAL_TABLET | ORAL | Status: DC | PRN
  Administered 2021-02-01 – 2021-02-03 (×4): 650 mg via ORAL
  Filled 2021-01-30 (×4): qty 2

## 2021-01-30 MED ORDER — CEFAZOLIN SODIUM-DEXTROSE 2-4 GM/100ML-% IV SOLN
INTRAVENOUS | Status: AC
  Filled 2021-01-30: qty 100

## 2021-01-30 MED ORDER — LIDOCAINE 2% (20 MG/ML) 5 ML SYRINGE
INTRAMUSCULAR | Status: DC | PRN
  Administered 2021-01-30: 60 mg via INTRAVENOUS

## 2021-01-30 MED ORDER — LIDOCAINE-EPINEPHRINE 1 %-1:100000 IJ SOLN
INTRAMUSCULAR | Status: DC | PRN
  Administered 2021-01-30: 10 mL

## 2021-01-30 MED ORDER — KETAMINE HCL 10 MG/ML IJ SOLN
INTRAMUSCULAR | Status: DC | PRN
  Administered 2021-01-30: 10 mg via INTRAVENOUS
  Administered 2021-01-30: 30 mg via INTRAVENOUS
  Administered 2021-01-30: 10 mg via INTRAVENOUS

## 2021-01-30 MED ORDER — ALBUMIN HUMAN 5 % IV SOLN: INTRAVENOUS | Status: DC | PRN

## 2021-01-30 MED ORDER — ACETAMINOPHEN ER 650 MG PO TBCR: 650.0000 mg | EXTENDED_RELEASE_TABLET | Freq: Three times a day (TID) | ORAL | Status: DC | PRN

## 2021-01-30 MED ORDER — ONDANSETRON HCL 4 MG/2ML IJ SOLN
INTRAMUSCULAR | Status: DC | PRN
  Administered 2021-01-30: 4 mg via INTRAVENOUS

## 2021-01-30 MED ORDER — PROPOFOL 500 MG/50ML IV EMUL
INTRAVENOUS | Status: DC | PRN
  Administered 2021-01-30: 100 ug/kg/min via INTRAVENOUS

## 2021-01-30 MED ORDER — CEFAZOLIN SODIUM-DEXTROSE 2-4 GM/100ML-% IV SOLN: 2.0000 g | INTRAVENOUS | Status: DC

## 2021-01-30 MED ORDER — PHENYLEPHRINE 40 MCG/ML (10ML) SYRINGE FOR IV PUSH (FOR BLOOD PRESSURE SUPPORT)
PREFILLED_SYRINGE | INTRAVENOUS | Status: DC | PRN
  Administered 2021-01-30 (×5): 80 ug via INTRAVENOUS

## 2021-01-30 MED ORDER — PROPOFOL 10 MG/ML IV BOLUS
INTRAVENOUS | Status: DC | PRN
  Administered 2021-01-30: 150 mg via INTRAVENOUS

## 2021-01-30 MED ORDER — CYCLOBENZAPRINE HCL 10 MG PO TABS
10.0000 mg | ORAL_TABLET | Freq: Three times a day (TID) | ORAL | Status: DC | PRN
  Administered 2021-01-30 – 2021-02-02 (×4): 10 mg via ORAL
  Filled 2021-01-30 (×4): qty 1

## 2021-01-30 MED ORDER — ACETAMINOPHEN 10 MG/ML IV SOLN
INTRAVENOUS | Status: DC | PRN
  Administered 2021-01-30: 1000 mg via INTRAVENOUS

## 2021-01-30 MED ORDER — DEXAMETHASONE SODIUM PHOSPHATE 10 MG/ML IJ SOLN
INTRAMUSCULAR | Status: DC | PRN
  Administered 2021-01-30: 5 mg via INTRAVENOUS

## 2021-01-30 MED ORDER — CEFAZOLIN SODIUM-DEXTROSE 2-4 GM/100ML-% IV SOLN
2.0000 g | INTRAVENOUS | Status: AC
  Administered 2021-01-30 (×2): 2 g via INTRAVENOUS

## 2021-01-30 MED ORDER — 0.9 % SODIUM CHLORIDE (POUR BTL) OPTIME
TOPICAL | Status: DC | PRN
  Administered 2021-01-30: 1000 mL

## 2021-01-30 MED ORDER — PANTOPRAZOLE SODIUM 40 MG IV SOLR
40.0000 mg | Freq: Every day | INTRAVENOUS | Status: DC
  Administered 2021-01-30 – 2021-02-01 (×3): 40 mg via INTRAVENOUS
  Filled 2021-01-30 (×3): qty 40

## 2021-01-30 MED ORDER — THROMBIN 5000 UNITS EX SOLR: OROMUCOSAL | Status: DC | PRN

## 2021-01-30 MED ORDER — CEFAZOLIN SODIUM 1 G IJ SOLR
INTRAMUSCULAR | Status: AC
  Filled 2021-01-30: qty 20

## 2021-01-30 MED ORDER — ROCURONIUM BROMIDE 10 MG/ML (PF) SYRINGE
PREFILLED_SYRINGE | INTRAVENOUS | Status: AC
  Filled 2021-01-30: qty 10

## 2021-01-30 MED ORDER — MENTHOL 3 MG MT LOZG: 1.0000 | LOZENGE | OROMUCOSAL | Status: DC | PRN

## 2021-01-30 MED ORDER — LIDOCAINE-EPINEPHRINE 1 %-1:100000 IJ SOLN
INTRAMUSCULAR | Status: AC
  Filled 2021-01-30: qty 1

## 2021-01-30 MED ORDER — DEXAMETHASONE SODIUM PHOSPHATE 10 MG/ML IJ SOLN
INTRAMUSCULAR | Status: AC
  Filled 2021-01-30: qty 1

## 2021-01-30 MED ORDER — ACETAMINOPHEN 10 MG/ML IV SOLN
INTRAVENOUS | Status: AC
  Filled 2021-01-30: qty 100

## 2021-01-30 MED ORDER — ONDANSETRON HCL 4 MG PO TABS: 4.0000 mg | ORAL_TABLET | Freq: Four times a day (QID) | ORAL | Status: DC | PRN

## 2021-01-30 MED ORDER — FENTANYL CITRATE (PF) 250 MCG/5ML IJ SOLN
INTRAMUSCULAR | Status: DC | PRN
  Administered 2021-01-30 (×4): 50 ug via INTRAVENOUS
  Administered 2021-01-30: 100 ug via INTRAVENOUS
  Administered 2021-01-30 (×2): 50 ug via INTRAVENOUS

## 2021-01-30 MED ORDER — HYDROMORPHONE HCL 1 MG/ML IJ SOLN: 0.5000 mg | INTRAMUSCULAR | Status: DC | PRN

## 2021-01-30 SURGICAL SUPPLY — 64 items
ANCHOR LUMBAR 27 (Anchor) ×9 IMPLANT
APPLIER CLIP 11 MED OPEN (CLIP) ×3
BATTALION LLIF ITRADISCAL SHIM (MISCELLANEOUS) ×3
BENZOIN TINCTURE PRP APPL 2/3 (GAUZE/BANDAGES/DRESSINGS) ×6 IMPLANT
BLADE CLIPPER SURG (BLADE) IMPLANT
BONE MATRIX OSTEOCEL PRO LRG (Bone Implant) ×6 IMPLANT
CANISTER SUCT 3000ML PPV (MISCELLANEOUS) ×3 IMPLANT
CLIP APPLIE 11 MED OPEN (CLIP) ×2 IMPLANT
CLIP SPRING STIM LLIF SAFEOP (CLIP) ×3 IMPLANT
COVER BACK TABLE 60X90IN (DRAPES) ×3 IMPLANT
COVER WAND RF STERILE (DRAPES) ×3 IMPLANT
DERMABOND ADVANCED (GAUZE/BANDAGES/DRESSINGS) ×2
DERMABOND ADVANCED .7 DNX12 (GAUZE/BANDAGES/DRESSINGS) ×4 IMPLANT
DILATOR INSULATED LLIF 8-13-18 (NEUROSURGERY SUPPLIES) ×6 IMPLANT
DRAPE C-ARM 42X72 X-RAY (DRAPES) ×6 IMPLANT
DRAPE C-ARMOR (DRAPES) ×6 IMPLANT
DRAPE LAPAROTOMY 100X72X124 (DRAPES) ×9 IMPLANT
DRAPE SURG 17X23 STRL (DRAPES) ×6 IMPLANT
DRSG OPSITE POSTOP 4X6 (GAUZE/BANDAGES/DRESSINGS) ×3 IMPLANT
ELECT BLADE 6.5 EXT (BLADE) ×3 IMPLANT
ELECT KIT SAFEOP EMG/NMJ (KITS) ×3
ELECT REM PT RETURN 9FT ADLT (ELECTROSURGICAL) ×6
ELECTRODE REM PT RTRN 9FT ADLT (ELECTROSURGICAL) ×4 IMPLANT
GAUZE 4X4 16PLY RFD (DISPOSABLE) IMPLANT
GLOVE BIO SURGEON STRL SZ7 (GLOVE) ×9 IMPLANT
GLOVE BIO SURGEON STRL SZ8 (GLOVE) ×15 IMPLANT
GLOVE EXAM NITRILE XL STR (GLOVE) IMPLANT
GLOVE INDICATOR 8.5 STRL (GLOVE) ×9 IMPLANT
GLOVE SURG UNDER POLY LF SZ7 (GLOVE) ×9 IMPLANT
GOWN STRL REUS W/ TWL LRG LVL3 (GOWN DISPOSABLE) ×4 IMPLANT
GOWN STRL REUS W/ TWL XL LVL3 (GOWN DISPOSABLE) ×10 IMPLANT
GOWN STRL REUS W/TWL 2XL LVL3 (GOWN DISPOSABLE) IMPLANT
GOWN STRL REUS W/TWL LRG LVL3 (GOWN DISPOSABLE) ×6
GOWN STRL REUS W/TWL XL LVL3 (GOWN DISPOSABLE) ×15
GUIDEWIRE LLIF TT 320 (WIRE) ×3 IMPLANT
HEMOSTAT POWDER KIT SURGIFOAM (HEMOSTASIS) ×6 IMPLANT
KIT BASIN OR (CUSTOM PROCEDURE TRAY) ×6 IMPLANT
KIT EMG SRFC ELECT (KITS) ×2 IMPLANT
KIT TURNOVER KIT B (KITS) ×3 IMPLANT
KNIFE ANNULOTOMY GREY RETRACT (ORTHOPEDIC DISPOSABLE SUPPLIES) ×3 IMPLANT
LIF ILLUMINATION SYSTEM STERIL (SYSTAGENIX WOUND MANAGEMENT) ×3
NEEDLE HYPO 25X1 1.5 SAFETY (NEEDLE) ×3 IMPLANT
NEEDLE SPNL 18GX3.5 QUINCKE PK (NEEDLE) ×3 IMPLANT
NS IRRIG 1000ML POUR BTL (IV SOLUTION) ×9 IMPLANT
PACK LAMINECTOMY NEURO (CUSTOM PROCEDURE TRAY) ×9 IMPLANT
PROBE BALL TIP LLIF SAFEOP (NEUROSURGERY SUPPLIES) ×3 IMPLANT
SHIM ITRADISCAL BATTALION LLIF (MISCELLANEOUS) ×2 IMPLANT
SPACER HEDRON IA 29X39 15 15D (Spacer) ×3 IMPLANT
SPACER IDENTITI 6X22X45 10D (Spacer) ×6 IMPLANT
SPONGE INTESTINAL PEANUT (DISPOSABLE) ×12 IMPLANT
SPONGE LAP 18X18 RF (DISPOSABLE) ×6 IMPLANT
SPONGE LAP 4X18 RFD (DISPOSABLE) IMPLANT
STRIP CLOSURE SKIN 1/2X4 (GAUZE/BANDAGES/DRESSINGS) ×3 IMPLANT
SUT PDS AB 1 CTX 36 (SUTURE) ×3 IMPLANT
SUT PROLENE 5 0 CC1 (SUTURE) ×3 IMPLANT
SUT VIC AB 2-0 CT1 18 (SUTURE) ×6 IMPLANT
SUT VIC AB 3-0 SH 8-18 (SUTURE) ×3 IMPLANT
SUT VIC AB 4-0 PS2 27 (SUTURE) ×9 IMPLANT
SUT VICRYL 4-0 PS2 18IN ABS (SUTURE) ×6 IMPLANT
SYSTEM ILLUMINATION LIF STERIL (SYSTAGENIX WOUND MANAGEMENT) ×2 IMPLANT
TOWEL GREEN STERILE (TOWEL DISPOSABLE) ×6 IMPLANT
TOWEL GREEN STERILE FF (TOWEL DISPOSABLE) ×6 IMPLANT
TRAY FOLEY MTR SLVR 16FR STAT (SET/KITS/TRAYS/PACK) ×6 IMPLANT
WATER STERILE IRR 1000ML POUR (IV SOLUTION) ×6 IMPLANT

## 2021-01-30 NOTE — H&P (Signed)
Cynthia Schaefer is an 60 y.o. female.   Chief Complaint: Back pain bilateral hip and leg pain neurogenic claudication HPI: 60 year old female with longstanding back bilateral hip and leg pain refractory to all forms of conservative treatment over the years.  Work-up is revealed severe spinal stenosis degenerative scoliosis and spondylolisthesis.  Patient presents for the first stage of a two-stage procedure and today she will get the anterior lumbar interbody fusion at L5-S1 and anterior lateral interbody fusions at L2-3 and L3-4.  I have extensively gone over the risks and benefits of that procedure with her as well as perioperative course expectations of outcome and alternatives of surgery and she understands and agrees to proceed forward.  Past Medical History:  Diagnosis Date  . Back pain    Severe  . Bilateral hip pain   . Bilateral leg pain   . Claudication (HCC)   . Depression   . Hypertension   . Hypothyroidism   . Scoliosis    L3-4  . Spinal stenosis of lumbar region     History reviewed. No pertinent surgical history.  Family History  Problem Relation Age of Onset  . COPD Mother   . Heart attack Father   . Colon cancer Neg Hx    Social History:  reports that she has never smoked. She has never used smokeless tobacco. She reports that she does not drink alcohol and does not use drugs.  Allergies:  Allergies  Allergen Reactions  . Metronidazole     REACTION: hives    Medications Prior to Admission  Medication Sig Dispense Refill  . acetaminophen (TYLENOL) 650 MG CR tablet Take 650 mg by mouth every 8 (eight) hours as needed for pain.    Marland Kitchen amLODipine (NORVASC) 5 MG tablet Take 5 mg by mouth daily.    Marland Kitchen ibuprofen (ADVIL) 200 MG tablet Take 400 mg by mouth every 6 (six) hours as needed for moderate pain or mild pain.    Marland Kitchen levothyroxine (SYNTHROID) 100 MCG tablet TAKE 1 TABLET BY MOUTH EVERY DAY (Patient taking differently: Take 100 mcg by mouth daily before breakfast.)  90 tablet 0  . meloxicam (MOBIC) 15 MG tablet TAKE 1 TABLET BY MOUTH EVERY DAY (Patient taking differently: Take 15 mg by mouth daily.) 90 tablet 1  . methylphenidate (RITALIN LA) 10 MG 24 hr capsule Take 10 mg by mouth daily as needed (alertness).    . Nutritional Supplements (ESTROVEN PO) Take 1 tablet by mouth daily.    . rosuvastatin (CRESTOR) 5 MG tablet TAKE 1 TABLET BY MOUTH AT NIGHT (Patient taking differently: Take 5 mg by mouth daily.) 90 tablet 1  . Venlafaxine HCl 225 MG TB24 Take 225 mg by mouth daily.      No results found for this or any previous visit (from the past 48 hour(s)). No results found.  Review of Systems  Musculoskeletal: Positive for back pain.  Neurological: Positive for weakness and numbness.    Blood pressure (!) 155/95, pulse 81, temperature (!) 97.3 F (36.3 C), temperature source Temporal, resp. rate 18, height 5\' 6"  (1.676 m), weight 94.3 kg, SpO2 99 %. Physical Exam HENT:     Head: Normocephalic.     Right Ear: Tympanic membrane normal.     Nose: Nose normal.  Eyes:     Pupils: Pupils are equal, round, and reactive to light.  Cardiovascular:     Rate and Rhythm: Normal rate.  Pulmonary:     Effort: Pulmonary effort is normal.  Abdominal:     General: Abdomen is flat.  Musculoskeletal:        General: Normal range of motion.  Skin:    General: Skin is warm.  Neurological:     General: No focal deficit present.     Mental Status: She is alert.     Comments: Is awake and alert strength is 5 out of 5 lower extremities iliopsoas, quads, hamstrings, gastrocs, into tibialis, and EHL.      Assessment/Plan 60 year old presents for anterior lumbar interbody fusions L5-S1 and anterior lateral interbody fusions L2-3 L3-4.  Mariam Dollar, MD 01/30/2021, 7:19 AM

## 2021-01-30 NOTE — Transfer of Care (Signed)
Immediate Anesthesia Transfer of Care Note  Patient: Cynthia Schaefer  Procedure(s) Performed: Lumbar Five -Sacral One Anterior Lumbar Interbody Fusion (N/A Spine Lumbar) Lumbar Two-Three,  Lumbar Three-Four  Anterolateral Lumbar Interbody Fusion (N/A Spine Lumbar) ABDOMINAL EXPOSURE (N/A )  Patient Location: PACU  Anesthesia Type:General  Level of Consciousness: drowsy and patient cooperative  Airway & Oxygen Therapy: Patient Spontanous Breathing and Patient connected to face mask oxygen  Post-op Assessment: Report given to RN, Post -op Vital signs reviewed and stable and on Neo gtt 25 mcg/min  Post vital signs: Reviewed and stable  Last Vitals:  Vitals Value Taken Time  BP 83/65 01/30/21 1323  Temp    Pulse 85 01/30/21 1327  Resp 14 01/30/21 1327  SpO2 94 % 01/30/21 1327  Vitals shown include unvalidated device data.  Last Pain:  Vitals:   01/30/21 0611  TempSrc:   PainSc: 2       Patients Stated Pain Goal: 1 (01/30/21 9643)  Complications: No complications documented.

## 2021-01-30 NOTE — Anesthesia Procedure Notes (Signed)
Procedure Name: Intubation Date/Time: 01/30/2021 7:42 AM Performed by: Renato Shin, CRNA Pre-anesthesia Checklist: Patient identified, Emergency Drugs available, Suction available and Patient being monitored Patient Re-evaluated:Patient Re-evaluated prior to induction Oxygen Delivery Method: Circle system utilized Preoxygenation: Pre-oxygenation with 100% oxygen Induction Type: IV induction Ventilation: Two handed mask ventilation required Laryngoscope Size: Mac and 3 Grade View: Grade I Tube type: Oral Tube size: 7.0 mm Number of attempts: 1 Airway Equipment and Method: Stylet Placement Confirmation: ETT inserted through vocal cords under direct vision,  positive ETCO2 and breath sounds checked- equal and bilateral Secured at: 21 cm Tube secured with: Tape Dental Injury: Teeth and Oropharynx as per pre-operative assessment  Comments: Atraumatic intubation by Dorene Grebe, SRNA.

## 2021-01-30 NOTE — H&P (Signed)
History and Physical Interval Note:  01/30/2021 7:29 AM  Cynthia Schaefer  has presented today for surgery, with the diagnosis of Other idiopathic scoliosis.  The various methods of treatment have been discussed with the patient and family. After consideration of risks, benefits and other options for treatment, the patient has consented to  Procedure(s): Lumbar 5 Sacral 1 Anterior lumbar interbody fusion (N/A) Lumbar 2-3 Lumbar 3-4 Anterolateral lumbar interbody fusion (N/A) ABDOMINAL EXPOSURE (N/A) as a surgical intervention.  The patient's history has been reviewed, patient examined, no change in status, stable for surgery.  I have reviewed the patient's chart and labs.  Questions were answered to the patient's satisfaction.    L5-S1 ALIF.  Risks and benefits discussed.  Cephus Shelling  Patient name: Cynthia Schaefer           MRN: [id number]        DOB: 08/09/1961        Sex: female  REASON FOR CONSULT: Evaluate for L5-S1 ALIF  HPI: Cynthia Schaefer is a 60 y.o. female, with history of hypertension, hyperlipidemia and chronic lower back pain who presents for evaluation of L5-S1 ALIF.  She describes years of lower back pain that has gotten worse.  She is now having radiculopathy in her left leg.  She has failed physical therapy injections and other conservative management.  She has been evaluated by Dr. Wynetta Emery with neurosurgery.  He has recommended ALIF at L5-S1 as well as L2-L3 and L3-L4 anterior lateral fusion and then later going back for posterior screw fixation at L2-S1.  Vascular surgery has been asked to assist with L5-S1 ALIF with anterior abdominal exposure.  She denies any history of abdominal surgery.  She has no incisions otherwise.  She denies any tobacco abuse.      Past Medical History:  Diagnosis Date  . Back pain    Severe  . Bilateral hip pain   . Bilateral leg pain   . Claudication (HCC)   . Depression   . Hypertension   . Hypothyroidism   . Scoliosis     L3-4  . Spinal stenosis of lumbar region     History reviewed. No pertinent surgical history.       Family History  Problem Relation Age of Onset  . COPD Mother   . Heart attack Father   . Colon cancer Neg Hx     SOCIAL HISTORY: Social History        Socioeconomic History  . Marital status: Married    Spouse name: Not on file  . Number of children: Not on file  . Years of education: Not on file  . Highest education level: Not on file  Occupational History  . Not on file  Tobacco Use  . Smoking status: Never Smoker  . Smokeless tobacco: Never Used  Substance and Sexual Activity  . Alcohol use: No    Alcohol/week: 0.0 standard drinks  . Drug use: No  . Sexual activity: Not on file  Other Topics Concern  . Not on file  Social History Narrative  . Not on file   Social Determinants of Health   Financial Resource Strain: Not on file  Food Insecurity: Not on file  Transportation Needs: Not on file  Physical Activity: Not on file  Stress: Not on file  Social Connections: Not on file  Intimate Partner Violence: Not on file         Allergies  Allergen Reactions  . Metronidazole  REACTION: hives          Current Outpatient Medications  Medication Sig Dispense Refill  . amLODipine (NORVASC) 5 MG tablet Take 5 mg by mouth daily.    Marland Kitchen levothyroxine (SYNTHROID) 100 MCG tablet TAKE 1 TABLET BY MOUTH EVERY DAY 90 tablet 0  . meloxicam (MOBIC) 15 MG tablet TAKE 1 TABLET BY MOUTH EVERY DAY 90 tablet 1  . rosuvastatin (CRESTOR) 5 MG tablet TAKE 1 TABLET BY MOUTH AT NIGHT 90 tablet 1  . Venlafaxine HCl 225 MG TB24 Take 1 tablet by mouth daily.    Marland Kitchen venlafaxine XR (EFFEXOR-XR) 75 MG 24 hr capsule Take 225 mg by mouth daily.     No current facility-administered medications for this visit.    REVIEW OF SYSTEMS:  [X]  denotes positive finding, [ ]  denotes negative finding Cardiac  Comments:  Chest pain or chest pressure:     Shortness of breath upon exertion:    Short of breath when lying flat:    Irregular heart rhythm:        Vascular    Pain in calf, thigh, or hip brought on by ambulation:    Pain in feet at night that wakes you up from your sleep:     Blood clot in your veins:    Leg swelling:         Pulmonary    Oxygen at home:    Productive cough:     Wheezing:         Neurologic    Sudden weakness in arms or legs:     Sudden numbness in arms or legs:     Sudden onset of difficulty speaking or slurred speech:    Temporary loss of vision in one eye:     Problems with dizziness:         Gastrointestinal    Blood in stool:     Vomited blood:         Genitourinary    Burning when urinating:     Blood in urine:        Psychiatric    Major depression:         Hematologic    Bleeding problems:    Problems with blood clotting too easily:        Skin    Rashes or ulcers:        Constitutional    Fever or chills:      PHYSICAL EXAM:    Vitals:   12/06/20 1132  BP: (!) 143/78  Pulse: 73  Resp: 16  Temp: 98.4 F (36.9 C)  TempSrc: Temporal  SpO2: 95%  Weight: 204 lb (92.5 kg)  Height: 5\' 6"  (1.676 m)    GENERAL: The patient is a well-nourished female, in no acute distress. The vital signs are documented above. CARDIAC: There is a regular rate and rhythm.  VASCULAR:  Palpable femoral pulses bilaterally Palpable dorsalis pedis pulses bilaterally PULMONARY: No respiratory distress. ABDOMEN: Soft and non-tender. MUSCULOSKELETAL: There are no major deformities or cyanosis. NEUROLOGIC: No focal weakness or paresthesias are detected. SKIN: There are no ulcers or rashes noted. PSYCHIATRIC: The patient has a normal affect.  DATA:   Reviewed her CT L-spine from July 29, 2020 and her MRI L spine from July 27th, 2021  Assessment/Plan:  60 year old female with chronic lower  back pain that presents for preop evaluation of planned L5-S1 ALIF with Dr. July 31, 2020.  He has scheduled her for a staged approach over multiple days in  the OR.  Vascular surgery has been asked to assist with the L5-S1 ALIF portion with abdominal approach.  I have reviewed her CT L-spine and MRI from earlier this year and she seems like she would be a good candidate for anterior approach.  I talked about a transverse incision over the left rectus muscle with mobilization of the left rectus and then entering the retroperitoneal space laterally and mobilizing peritoneum and ureter across midline as well as mobilizing iliac artery and vein for exposure of the L5-S1 disc space.  We talked about risk and benefits including injury to the above structures.  Look forward to assisting Dr. Clint Guy, MD [facility]: [phone]

## 2021-01-30 NOTE — Op Note (Signed)
Preoperative diagnosis: Degenerative disease L5-S1 grade 1 spondylolisthesis L5-S1 with lumbar spinal stenosis and lumbar radiculopathy  Postoperative diagnosis: Same  Procedure: Anterior lumbar interbody fusion L5-S1 utilizing the globus titanium cage with integrated Shanks packed with osteocell pro 15 mm 15 degree implant large footprint  Surgeon: Jillyn Hidden Corena Tilson  Co-surgeon Dr. Sherald Hess  Assistant: Julien Girt  Anesthesia: General  EBL: Minimal  HPI: 60 year old female presented for the first stage of deformity correction she has severe spinal stenosis grade 1 spondylolisthesis back pain lumbar radiculopathy neurogenic claudication.  Work-up revealed multilevel degenerative disease from L1 down to the sacrum and so we recommended L5-S1 anterior lumbar interbody fusion as the start of the correction with through separate incisions anterior lateral interbody fusions at L2-3 and L3-4 as well as delayed posterior fixation.  I extensively over the risks and benefits of the L5-S1 anterior lumbar interbody fusion with her as well as perioperative course expectations of outcome and alternatives of surgery and she understood and agreed to proceed forward.  Operative procedure: Exposure be dictated by Dr. Cristal Deer for Chestine Spore but after the patient was brought into the OR was due to general anesthesia positioned supine the incision was localized Dr. Chestine Spore performed exposure and after adequate exposure been achieved we confirmed correct level and midline with AP and lateral fluoroscopy disc base was incised cleaned out pituitary rongeurs the endplates were scraped down to the posterior ossific complex using utilizing 2 Miller Kerrison punch under bit both endplates to decompress the ventral epidural space.  And after a squared off all endplates I sized up sequentially from 13 mm 50 mm implants and selected a 15 mm 15 degree large footprint implant.  I packed with the osteocell pro and inserted it  under fluoroscopy.  I then deployed the [place] both superiorly and inferiorly.  Then inspected the implant under AP and lateral fluoroscopy confirmed to be in good position.  Postop x-ray also confirmed no foreign bodies sponges or instruments left behind.  The retractor was then removed prior to x-rays and the wound was closed with Prolene in the fascia Vicryl in subcutaneous skin and subcuticular.  Patient was then repositioned for the anterior lateral interbody fusions at L2-3 and L3-4.  The of the case all needle count sponge counts were correct.

## 2021-01-30 NOTE — Op Note (Signed)
Date: January 30, 2021  Preoperative diagnosis: Degenerative disc disease with chronic lower back pain  Postoperative diagnosis: Same  Procedure: Anterior spine exposure at the L5-S1 disc space via anterior retroperitoneal approach for L5-S1 ALIF  Surgeon: Dr. Cephus Shelling, MD  Co-surgeon: Dr. Donalee Citrin, MD  Assistant: Dr. Elna Breslow, MD  Indications: Patient is a 60 year old female who was seen with chronic back and hip pain with neurogenic claudication.  Ultimately she has failed conservative treatment.  She is scheduled for a two-stage procedure with Dr. Wynetta Emery with neurosurgery.  Vascular surgery was asked to assist with anterior spine exposure at the L5-S1 disc space for ALIF.  She presents after risk benefits discussed.  Findings: Transverse incision over the left rectus muscle at the L5-S1 disc space where she has transitional anatomy.  Ultimately the peritoneum and left ureter were mobilized to the midline and the left rectus muscle was mobilized lateral.  The middle sacral vessels were divided between clips.  The left iliac vein was mobilized lateral.  Ultimately we confirmed that we were at the correct L5-S1 level after placing fixed retractors and put a needle in the disc space and this was confirmed on lateral fluoroscopy  Anesthesia: General  Details: Patient was taken to the operating room after informed consent was obtained.  Placed on the operative table in supine position.  General endotracheal anesthesia was induced.  We brought a fluoroscopic C arm in the lateral position we marked the L5-S1 disc space on the anterior abdominal wall over the left rectus.  Abdominal wall was then prepped and draped in usual sterile fashion.  She got pre-operative antibiotics and a timeout was performed to confirm patient, procedure and site.  Initially made a transverse incision over the left rectus muscle at our mark.  Dissected down with Bovie cautery  And opened the subcutaneous tissue  until we encountered the anterior rectus sheath.  Cerebellar retractors were placed for added visualization.  I opened the anterior rectus sheath transversely with Bovie cautery and circumferentially mobilized the left rectus.  I then entered lateral to the rectus muscle and the peritoneum was identified in the retroperitoneum and then bluntly mobilized peritoneum and intestines toward the midline including the left ureter and we identified the left iliac vessels and the left psoas muscle.  Dr. Lenell Antu used hand-held Wiley retractors to pull the peritoneum and ureter toward the midline while I used a Balfour retractor for added visualization.  I continue to bluntly mobilize the peritoneum and left ureter across the midline with KD and suction and identified the left iliac vein and middle sacral vessels.  Middle sacral vessels were divided between clips.  I then bluntly mobilized the left iliac vein lateral to the disc space and also bluntly mobilized on the right side of the space.  I did lose my clip on one of the middle sacral vessels and this had to be repaired with a 5-0 Prolene on the iliac vein.  I then placed a fixed Thompson retractor on the field.  200 reverse lip was placed on the right side of the disc space and a 150 reverse lip was placed on the left side of the disc space with fixed malleable retractors cranial caudal.  Ultimately we confirmed with spinal needle in the disc space on lateral fluoroscopy that we were at the correct level after fixed retractors were placed.  Case was turned over Dr. Wynetta Emery.  I was called back into the room later in the case to reposition  the reverse lip retractor on the left side of the disc space where the iliac vein had moved back into the surgical field.  Ultimately I gentle retracted the left iliac vein and mobilized it with a KD and replaced the reverse lip retractor to expose the disc space with no issue.  She remained stable.    Complication: None  Condition:  Stable  Cephus Shelling, MD [facility] Office: [phone]   Cephus Shelling

## 2021-01-30 NOTE — Anesthesia Procedure Notes (Addendum)
Arterial Line Insertion Start/End2/14/2022 7:45 AM, 01/30/2021 7:55 AM Performed by: Adria Dill, CRNA, CRNA  Patient location: OR. Preanesthetic checklist: patient identified, IV checked, site marked, risks and benefits discussed, surgical consent, monitors and equipment checked, pre-op evaluation, timeout performed and anesthesia consent Patient sedated Left, radial was placed Catheter size: 20 G Hand hygiene performed , maximum sterile barriers used  and Seldinger technique used  Attempts: 1 Procedure performed without using ultrasound guided technique. Ultrasound Notes:anatomy identified and no ultrasound evidence of intravascular and/or intraneural injection Following insertion, dressing applied and Biopatch. Post procedure assessment: normal and unchanged  Patient tolerated the procedure well with no immediate complications. Additional procedure comments: Attempt by SRNA with flash, unable to pass catheter, 1 successful attempt by CRNA.Marland Kitchen

## 2021-01-30 NOTE — Anesthesia Postprocedure Evaluation (Signed)
Anesthesia Post Note  Patient: Cynthia Schaefer  Procedure(s) Performed: Lumbar Five -Sacral One Anterior Lumbar Interbody Fusion (N/A Spine Lumbar) Lumbar Two-Three,  Lumbar Three-Four  Anterolateral Lumbar Interbody Fusion (N/A Spine Lumbar) ABDOMINAL EXPOSURE (N/A )     Patient location during evaluation: PACU Anesthesia Type: General Level of consciousness: awake and alert Pain management: pain level controlled Vital Signs Assessment: post-procedure vital signs reviewed and stable Respiratory status: spontaneous breathing, nonlabored ventilation, respiratory function stable and patient connected to nasal cannula oxygen Cardiovascular status: blood pressure returned to baseline and stable Postop Assessment: no apparent nausea or vomiting Anesthetic complications: no   No complications documented.  Last Vitals:  Vitals:   01/30/21 1550 01/30/21 1631  BP: 99/68 93/61  Pulse: 95 92  Resp: 15 18  Temp: 36.4 C (!) 36.4 C  SpO2: 98% 94%    Last Pain:  Vitals:   01/30/21 1631  TempSrc: Oral  PainSc:                  Shelton Silvas

## 2021-01-30 NOTE — Anesthesia Preprocedure Evaluation (Addendum)
Anesthesia Evaluation  Patient identified by MRN, date of birth, ID band Patient awake    Reviewed: Allergy & Precautions, NPO status , Patient's Chart, lab work & pertinent test results  Airway Mallampati: I  TM Distance: >3 FB Neck ROM: Full    Dental  (+) Teeth Intact, Dental Advisory Given, Caps   Pulmonary neg pulmonary ROS,    breath sounds clear to auscultation       Cardiovascular hypertension, Pt. on medications  Rhythm:Regular Rate:Normal     Neuro/Psych PSYCHIATRIC DISORDERS Depression negative neurological ROS     GI/Hepatic negative GI ROS, Neg liver ROS,   Endo/Other  Hypothyroidism   Renal/GU negative Renal ROS     Musculoskeletal negative musculoskeletal ROS (+)   Abdominal Normal abdominal exam  (+)   Peds  Hematology negative hematology ROS (+)   Anesthesia Other Findings   Reproductive/Obstetrics                            Anesthesia Physical Anesthesia Plan  ASA: II  Anesthesia Plan: General   Post-op Pain Management:    Induction: Intravenous  PONV Risk Score and Plan: 4 or greater and Ondansetron, Dexamethasone, Midazolam and Scopolamine patch - Pre-op  Airway Management Planned: Oral ETT  Additional Equipment: Arterial line  Intra-op Plan:   Post-operative Plan: Extubation in OR  Informed Consent: I have reviewed the patients History and Physical, chart, labs and discussed the procedure including the risks, benefits and alternatives for the proposed anesthesia with the patient or authorized representative who has indicated his/her understanding and acceptance.     Dental advisory given  Plan Discussed with: CRNA  Anesthesia Plan Comments:        Anesthesia Quick Evaluation

## 2021-01-30 NOTE — Op Note (Signed)
Preoperative diagnosis: Degenerative disc disease lumbar spinal stenosis and degenerative scoliosis L2-3 L3-4  Postoperative diagnosis: Same  Procedure: Anterior lateral interbody fusions at L2-3 and L3-4 utilizing the [employer] titanium cages packed with osteocell pro with intraoperative neural monitoring  Surgeon: Jillyn Hidden Derry Kassel  Assistant: Julien Girt  Anesthesia: General  EBL: Minimal  HPI: 60 year old female with severe degenerative scoliosis lumbar spinal stenosis neurogenic claudication radiculopathy extending from L1 down to the pelvis patient was recommended staged deformity correction and decompression with the for stage being an L5-S1 anterior lumbar interbody fusion that was performed earlier dictated on a separate op note.  We recommended also upon completion of the anterior lumbar interbody fusion at L5-S1 doing an anterior lateral interbody fusions at L2-3 and L3-4.  I extensively went over the risks and benefits of that procedure with her as well as perioperative course expectations of outcome and alternatives of surgery and she understood and agreed to proceed forward.  Operative procedure: Patient was brought into the OR and induced under general anesthesia and upon completion of the anterior lumbar interbody fusion at L5-S1 anesthesia was continued without muscle relaxation and position left side up right-side-down and utilizing fluoroscopy patient was taped and positioned to correctly align for the entry points into L2-3 and L3-4.  3 incisions were drawn out to laterally for entry point into the disc base 1 posteriorly for access in the retroperitoneal space and after confirmation of bed positioning in the interspaces and infiltration of 10 cc lidocaine with epi all 3 incisions were opened up then through the posterior incision identify the transverse process with the retroperitoneal space and passed a guide along my finger down overlying the L2-3 disc base confirmed by fluoroscopy.   Position myself in the at about the 130 yard line on the posterior aspect of the disc base stimulated and found a clear entry zone with stimulation greater than 19.  Deployed the K wire and then through sequential dilation with continuous neuro monitoring I then placed the retractor and confirmed its position with fluoroscopy.  I then stimulated the 3 and 6 degree orientation in the exposed area opened up the retractor deployed the shim and after adequate retractor position then incised the disc base cleaned out the disc base utilizing Cobbs I did contralateral release prepared the endplates with crops rasps and an angled curette and then sequentially dilated up and selected a 22 mm implant that was lordotic with 10 degrees anterior lordosis 6 degrees posterior lordosis 6 mm tall felt to be adequate positioning in the interspace.  I packed it with the ostia cell pro and after adequate endplate preparation utilizing slides deployed the cage and confirmed good position under fluoroscopy.  I then remove the retractor and in a similar fashion reposition the dilator as anchoring down and L3-4.  In a similar fashion sequential dilation retractor positioning and continuous neuro monitoring I found an adequate entry point incised the disc base cleaned out the disc prepared the endplates and deployed a second cage at L3-4 under fluoroscopy.  Both cages appear to be in good position significantly reduced her collapse and scoliotic deformity at those levels.  Then all incisions were then copiously irrigated and closed with interrupted Vicryl and running 4 subcuticular Dermabond sterile dressings applied patient recovery in stable condition.  At the end the case all needle count sponge counts were correct.

## 2021-01-30 NOTE — Progress Notes (Signed)
Orthopedic Tech Progress Note Patient Details:  Cynthia Schaefer 1961/09/09 [id number] Called in order to [facility] for an ASPEN LUMBAR BRACE Patient ID: Cynthia Schaefer, female   DOB: 1961/02/11, 60 y.o.   MRN: [id number]   [name] 01/30/2021, 3:25 PM

## 2021-01-31 MED ORDER — ENSURE PRE-SURGERY PO LIQD
296.0000 mL | Freq: Once | ORAL | Status: AC
  Administered 2021-01-31: 296 mL via ORAL
  Filled 2021-01-31: qty 296

## 2021-01-31 MED ORDER — CHLORHEXIDINE GLUCONATE CLOTH 2 % EX PADS
6.0000 | MEDICATED_PAD | Freq: Once | CUTANEOUS | Status: AC
  Administered 2021-01-31: 6 via TOPICAL

## 2021-01-31 MED ORDER — CEFAZOLIN SODIUM-DEXTROSE 2-4 GM/100ML-% IV SOLN
2.0000 g | INTRAVENOUS | Status: AC
  Administered 2021-02-01 (×2): 2 g via INTRAVENOUS

## 2021-01-31 MED ORDER — CEFAZOLIN SODIUM-DEXTROSE 2-4 GM/100ML-% IV SOLN: 2.0000 g | INTRAVENOUS | Status: DC

## 2021-01-31 NOTE — Progress Notes (Addendum)
[facility]  Subjective  - no complaints.   Objective 106/66 80 98 F (36.7 C) (Oral) 17 97%  Intake/Output Summary (Last 24 hours) at 01/31/2021 0757 Last data filed at 01/31/2021 0030 Gross per 24 hour  Intake 3102.5 ml  Output 2775 ml  Net 327.5 ml    Abdominal incision c/d/i Left DP palpable  Laboratory Lab Results: Recent Labs    01/30/21 0931 01/30/21 1215  HGB 10.2* 10.9*  HCT 30.0* 32.0*   BMET Recent Labs    01/30/21 0931 01/30/21 1215  NA 140 139  K 3.0* 3.4*    COAG No results found for: INR, PROTIME No results found for: PTT  Assessment/Planning:  POD#1 s/p L5-S1 ALIF.  Looks good this am.  Appropriate post-op incisional tenderness.  Left DP palpable.  No immediate vascular concerns.    Cephus Shelling 01/31/2021 7:57 AM --

## 2021-01-31 NOTE — Progress Notes (Signed)
Subjective: Patient reports Condition of back pain otherwise no radicular symptoms  Objective: Vital signs in last 24 hours: Temp:  [97.5 F (36.4 C)-98.4 F (36.9 C)] 98.1 F (36.7 C) (02/15 0356) Pulse Rate:  [78-95] 78 (02/15 0356) Resp:  [11-25] 16 (02/15 0356) BP: (83-114)/(56-74) 114/61 (02/15 0356) SpO2:  [92 %-100 %] 95 % (02/15 0356) Arterial Line BP: (87-120)/(44-63) 120/61 (02/14 1550)  Intake/Output from previous day: 02/14 0701 - 02/15 0700 In: 3102.5 [I.V.:2382.5; IV Piggyback:720] Out: 2775 [Urine:2525; Blood:250] Intake/Output this shift: No intake/output data recorded.  Awake alert strength 5-5 incisions clean dry and intact  Lab Results: Recent Labs    01/30/21 0931 01/30/21 1215  HGB 10.2* 10.9*  HCT 30.0* 32.0*   BMET Recent Labs    01/30/21 0931 01/30/21 1215  NA 140 139  K 3.0* 3.4*    Studies/Results: DG Lumbar Spine 2-3 Views  Result Date: 01/30/2021 CLINICAL DATA:  Multilevel lumbar fusion EXAM: LUMBAR SPINE - 2-3 VIEW; DG C-ARM 1-60 MIN COMPARISON:  Intraoperative film from earlier in the same day. FLUOROSCOPY TIME:  Radiation Exposure Index (as provided by the fluoroscopic device): 212.6 mGy If the device does not provide the exposure index: Fluoroscopy Time:  2 minutes 53 seconds Number of Acquired Images:  3 FINDINGS: Initial images again demonstrate prior fusion at L5-S1. Subsequent L2-3 and L3-4 lateral fusion was performed. No retained foreign bodies are identified. IMPRESSION: No evidence of retained foreign body on lumbar fusion spot films. Critical Value/emergent results were called by telephone at the time of interpretation on 01/30/2021 at 12:57 pm to OR 20, who verbally acknowledged these results. Electronically Signed   By: Alcide Clever M.D.   On: 01/30/2021 13:03   DG C-Arm 1-60 Min  Result Date: 01/30/2021 CLINICAL DATA:  Multilevel lumbar fusion EXAM: LUMBAR SPINE - 2-3 VIEW; DG C-ARM 1-60 MIN COMPARISON:  Intraoperative film  from earlier in the same day. FLUOROSCOPY TIME:  Radiation Exposure Index (as provided by the fluoroscopic device): 212.6 mGy If the device does not provide the exposure index: Fluoroscopy Time:  2 minutes 53 seconds Number of Acquired Images:  3 FINDINGS: Initial images again demonstrate prior fusion at L5-S1. Subsequent L2-3 and L3-4 lateral fusion was performed. No retained foreign bodies are identified. IMPRESSION: No evidence of retained foreign body on lumbar fusion spot films. Critical Value/emergent results were called by telephone at the time of interpretation on 01/30/2021 at 12:57 pm to OR 20, who verbally acknowledged these results. Electronically Signed   By: Alcide Clever M.D.   On: 01/30/2021 13:03   DG OR LOCAL ABDOMEN  Result Date: 01/30/2021 CLINICAL DATA:  Intraoperative evaluation for retained instrument during anterior lumbar fixation EXAM: OR LOCAL ABDOMEN COMPARISON:  None. FINDINGS: Scattered large and small bowel gas is noted. Changes consistent with the known operative fusion are seen. No retained foreign body is noted. IMPRESSION: No evidence of retained foreign body. Critical Value/emergent results were called by telephone at the time of interpretation on 01/30/2021 at 10:34 am to Sue Lush in OR 20 , who verbally acknowledged these results. Electronically Signed   By: Alcide Clever M.D.   On: 01/30/2021 10:40    Assessment/Plan: Postop day 1 stage I procedure L5-S1 ALIF, L2-3 L3-4 anterior lateral interbody fusions.  Patient is preop for tomorrow where she will undergo posterior spinal fixation and TLIF L4-5  LOS: 1 day     Mariam Dollar 01/31/2021, 7:42 AM

## 2021-01-31 NOTE — Evaluation (Signed)
Physical Therapy Evaluation Patient Details Name: Cynthia Schaefer MRN: [id number] DOB: [birth year] Today's Date: 01/31/2021   History of Present Illness  Pt is a 60 y/o female s/p L5-S1 ALIF, L2-3 L3-4 anterior lateral interbody fusions. Pt for second part of surgery on 2/16. PMH includes HTN and scoliosis.  Clinical Impression  Patient is s/p above surgery resulting in the deficits listed below (see PT Problem List). Pt very guarded during gait and required standing rest X2. Requiring min to min guard A for mobility tasks this session. Educated about back precautions and walking program. Reports her husband and sister will be able to assist at d/c. Patient will benefit from skilled PT to increase their independence and safety with mobility (while adhering to their precautions) to allow discharge to the venue listed below.     Follow Up Recommendations No PT follow up;Supervision for mobility/OOB    Equipment Recommendations  Rolling walker with 5" wheels;3in1 (PT)    Recommendations for Other Services       Precautions / Restrictions Precautions Precautions: Back Precaution Booklet Issued: Yes (comment) Precaution Comments: Reviewed back precautions with pt. Required Braces or Orthoses: Spinal Brace Spinal Brace: Lumbar corset Restrictions Weight Bearing Restrictions: No      Mobility  Bed Mobility Overal bed mobility: Needs Assistance Bed Mobility: Sidelying to Sit;Sit to Sidelying   Sidelying to sit: Min assist     Sit to sidelying: Min assist General bed mobility comments: Min A for assist with LEs throughout bed mobility. Increased time required.    Transfers Overall transfer level: Needs assistance Equipment used: None Transfers: Sit to/from Stand Sit to Stand: Min guard         General transfer comment: Min guard A for safety. Increased time required. Pt using hands to walk up thighs  Ambulation/Gait Ambulation/Gait assistance: Min guard Gait Distance  (Feet): 40 Feet Assistive device: IV Pole Gait Pattern/deviations: Step-through pattern;Decreased stride length;Shuffle Gait velocity: Decreased   General Gait Details: Very slow, cautious gait. Shuffle type steps. Required standing rest X2 during ambulation. Educated about walking program to perform at home.  Stairs            Wheelchair Mobility    Modified Rankin (Stroke Patients Only)       Balance Overall balance assessment: Needs assistance Sitting-balance support: Feet supported;No upper extremity supported Sitting balance-Leahy Scale: Good     Standing balance support: No upper extremity supported;Single extremity supported Standing balance-Leahy Scale: Fair Standing balance comment: Able to maintain static standing without UE support                             Pertinent Vitals/Pain Pain Assessment: Faces Faces Pain Scale: Hurts even more Pain Location: back Pain Descriptors / Indicators: Aching;Operative site guarding Pain Intervention(s): Limited activity within patient's tolerance;Monitored during session;Repositioned    Home Living Family/patient expects to be discharged to:: Private residence Living Arrangements: Spouse/significant other Available Help at Discharge: Family;Available 24 hours/day Type of Home: House Home Access: Stairs to enter Entrance Stairs-Rails: Right;Left;Can reach both Entrance Stairs-Number of Steps: 2 Home Layout: One level Home Equipment: None      Prior Function Level of Independence: Independent               Hand Dominance        Extremity/Trunk Assessment   Upper Extremity Assessment Upper Extremity Assessment: Defer to OT evaluation    Lower Extremity Assessment Lower Extremity Assessment: Generalized weakness  Cervical / Trunk Assessment Cervical / Trunk Assessment: Other exceptions Cervical / Trunk Exceptions: s/p lumbar surgery  Communication   Communication: No difficulties   Cognition Arousal/Alertness: Suspect due to medications;Awake/alert Behavior During Therapy: WFL for tasks assessed/performed Overall Cognitive Status: No family/caregiver present to determine baseline cognitive functioning                                 General Comments: Pt slow to respond at times, but may be due to medications.      General Comments      Exercises     Assessment/Plan    PT Assessment Patient needs continued PT services  PT Problem List Decreased strength;Decreased activity tolerance;Decreased balance;Decreased mobility;Decreased knowledge of precautions;Pain       PT Treatment Interventions DME instruction;Gait training;Functional mobility training;Stair training;Therapeutic activities;Therapeutic exercise;Balance training;Patient/family education    PT Goals (Current goals can be found in the Care Plan section)  Acute Rehab PT Goals Patient Stated Goal: to decrease pain PT Goal Formulation: With patient Time For Goal Achievement: 02/14/21 Potential to Achieve Goals: Good    Frequency Min 5X/week   Barriers to discharge        Co-evaluation               AM-PAC PT "6 Clicks" Mobility  Outcome Measure Help needed turning from your back to your side while in a flat bed without using bedrails?: A Little Help needed moving from lying on your back to sitting on the side of a flat bed without using bedrails?: A Little Help needed moving to and from a bed to a chair (including a wheelchair)?: A Little Help needed standing up from a chair using your arms (e.g., wheelchair or bedside chair)?: A Little Help needed to walk in hospital room?: A Little Help needed climbing 3-5 steps with a railing? : A Lot 6 Click Score: 17    End of Session Equipment Utilized During Treatment: Gait belt;Back brace Activity Tolerance: Patient limited by pain Patient left: in bed;with call bell/phone within reach Nurse Communication: Mobility status PT  Visit Diagnosis: Unsteadiness on feet (R26.81);Muscle weakness (generalized) (M62.81);Pain Pain - part of body:  (back)    Time: 7353-2992 PT Time Calculation (min) (ACUTE ONLY): 18 min   Charges:   PT Evaluation $PT Eval Low Complexity: 1 Low          Cindee Salt, DPT  Acute Rehabilitation Services  Pager: [phone] Office: [phone]   Lehman Prom 01/31/2021, 10:35 AM

## 2021-02-01 ENCOUNTER — Encounter (HOSPITAL_COMMUNITY): Payer: Self-pay | Admitting: Neurosurgery

## 2021-02-01 ENCOUNTER — Encounter (HOSPITAL_COMMUNITY)
Admission: RE | Disposition: A | Discharge status: Home / Self Care | Payer: Self-pay | Source: Home / Self Care | Attending: Neurosurgery

## 2021-02-01 ENCOUNTER — Inpatient Hospital Stay (HOSPITAL_COMMUNITY): Admission: RE | Admit: 2021-02-01 | Payer: BC Managed Care – PPO | Source: Home / Self Care | Admitting: Neurosurgery

## 2021-02-01 ENCOUNTER — Inpatient Hospital Stay (HOSPITAL_COMMUNITY): Payer: BC Managed Care – PPO | Admitting: Certified Registered Nurse Anesthetist

## 2021-02-01 ENCOUNTER — Inpatient Hospital Stay (HOSPITAL_COMMUNITY): Payer: BC Managed Care – PPO

## 2021-02-01 DIAGNOSIS — M48061 Spinal stenosis, lumbar region without neurogenic claudication: Secondary | ICD-10-CM | POA: Diagnosis present

## 2021-02-01 HISTORY — PX: POSTERIOR LUMBAR FUSION 4 LEVEL: SHX6037

## 2021-02-01 SURGERY — POSTERIOR LUMBAR FUSION 4 LEVEL
Anesthesia: General | Site: Spine Lumbar

## 2021-02-01 MED ORDER — DEXAMETHASONE SODIUM PHOSPHATE 10 MG/ML IJ SOLN
INTRAMUSCULAR | Status: DC | PRN
  Administered 2021-02-01: 10 mg via INTRAVENOUS

## 2021-02-01 MED ORDER — ROCURONIUM BROMIDE 10 MG/ML (PF) SYRINGE
PREFILLED_SYRINGE | INTRAVENOUS | Status: DC | PRN
  Administered 2021-02-01: 10 mg via INTRAVENOUS
  Administered 2021-02-01: 60 mg via INTRAVENOUS
  Administered 2021-02-01 (×2): 20 mg via INTRAVENOUS

## 2021-02-01 MED ORDER — MIDAZOLAM HCL 2 MG/2ML IJ SOLN
INTRAMUSCULAR | Status: DC | PRN
  Administered 2021-02-01: 2 mg via INTRAVENOUS

## 2021-02-01 MED ORDER — KETAMINE HCL 10 MG/ML IJ SOLN
INTRAMUSCULAR | Status: DC | PRN
  Administered 2021-02-01: 15 mg via INTRAVENOUS
  Administered 2021-02-01: 10 mg via INTRAVENOUS
  Administered 2021-02-01: 25 mg via INTRAVENOUS

## 2021-02-01 MED ORDER — DEXMEDETOMIDINE (PRECEDEX) IN NS 20 MCG/5ML (4 MCG/ML) IV SYRINGE
PREFILLED_SYRINGE | INTRAVENOUS | Status: DC | PRN
  Administered 2021-02-01: 4 ug via INTRAVENOUS

## 2021-02-01 MED ORDER — FENTANYL CITRATE (PF) 100 MCG/2ML IJ SOLN
25.0000 ug | INTRAMUSCULAR | Status: DC | PRN
  Administered 2021-02-01: 25 ug via INTRAVENOUS

## 2021-02-01 MED ORDER — PHENYLEPHRINE 40 MCG/ML (10ML) SYRINGE FOR IV PUSH (FOR BLOOD PRESSURE SUPPORT)
PREFILLED_SYRINGE | INTRAVENOUS | Status: AC
  Filled 2021-02-01: qty 10

## 2021-02-01 MED ORDER — 0.9 % SODIUM CHLORIDE (POUR BTL) OPTIME
TOPICAL | Status: DC | PRN
  Administered 2021-02-01: 1000 mL

## 2021-02-01 MED ORDER — PHENYLEPHRINE HCL-NACL 10-0.9 MG/250ML-% IV SOLN
INTRAVENOUS | Status: DC | PRN
  Administered 2021-02-01: 25 ug/min via INTRAVENOUS

## 2021-02-01 MED ORDER — FENTANYL CITRATE (PF) 100 MCG/2ML IJ SOLN
INTRAMUSCULAR | Status: DC | PRN
  Administered 2021-02-01: 50 ug via INTRAVENOUS
  Administered 2021-02-01: 100 ug via INTRAVENOUS
  Administered 2021-02-01 (×2): 50 ug via INTRAVENOUS

## 2021-02-01 MED ORDER — ONDANSETRON HCL 4 MG PO TABS: 4.0000 mg | ORAL_TABLET | Freq: Four times a day (QID) | ORAL | Status: DC | PRN

## 2021-02-01 MED ORDER — SUGAMMADEX SODIUM 200 MG/2ML IV SOLN
INTRAVENOUS | Status: DC | PRN
  Administered 2021-02-01: 200 mg via INTRAVENOUS

## 2021-02-01 MED ORDER — CEFAZOLIN SODIUM 1 G IJ SOLR
INTRAMUSCULAR | Status: AC
  Filled 2021-02-01: qty 20

## 2021-02-01 MED ORDER — ACETAMINOPHEN 10 MG/ML IV SOLN
INTRAVENOUS | Status: AC
  Filled 2021-02-01: qty 100

## 2021-02-01 MED ORDER — PHENYLEPHRINE 40 MCG/ML (10ML) SYRINGE FOR IV PUSH (FOR BLOOD PRESSURE SUPPORT)
PREFILLED_SYRINGE | INTRAVENOUS | Status: DC | PRN
  Administered 2021-02-01 (×2): 80 ug via INTRAVENOUS
  Administered 2021-02-01: 120 ug via INTRAVENOUS

## 2021-02-01 MED ORDER — ALBUMIN HUMAN 5 % IV SOLN: INTRAVENOUS | Status: DC | PRN

## 2021-02-01 MED ORDER — CEFAZOLIN SODIUM-DEXTROSE 2-4 GM/100ML-% IV SOLN
2.0000 g | Freq: Three times a day (TID) | INTRAVENOUS | Status: AC
  Administered 2021-02-01 – 2021-02-03 (×5): 2 g via INTRAVENOUS
  Filled 2021-02-01 (×5): qty 100

## 2021-02-01 MED ORDER — PROPOFOL 10 MG/ML IV BOLUS
INTRAVENOUS | Status: DC | PRN
  Administered 2021-02-01: 150 mg via INTRAVENOUS

## 2021-02-01 MED ORDER — CYCLOBENZAPRINE HCL 10 MG PO TABS: 10.0000 mg | ORAL_TABLET | Freq: Three times a day (TID) | ORAL | Status: DC | PRN

## 2021-02-01 MED ORDER — ACETAMINOPHEN 10 MG/ML IV SOLN: 1000.0000 mg | Freq: Once | INTRAVENOUS | Status: DC | PRN

## 2021-02-01 MED ORDER — BUPIVACAINE LIPOSOME 1.3 % IJ SUSP
20.0000 mL | INTRAMUSCULAR | Status: AC
  Administered 2021-02-01: 20 mL
  Filled 2021-02-01: qty 20

## 2021-02-01 MED ORDER — OXYCODONE HCL 5 MG PO TABS
5.0000 mg | ORAL_TABLET | Freq: Once | ORAL | Status: DC | PRN
Start: 2021-02-01 — End: 2021-02-01

## 2021-02-01 MED ORDER — FENTANYL CITRATE (PF) 100 MCG/2ML IJ SOLN
INTRAMUSCULAR | Status: AC
  Filled 2021-02-01: qty 2

## 2021-02-01 MED ORDER — LIDOCAINE-EPINEPHRINE 1 %-1:100000 IJ SOLN
INTRAMUSCULAR | Status: DC | PRN
  Administered 2021-02-01: 10 mL

## 2021-02-01 MED ORDER — EPHEDRINE SULFATE-NACL 50-0.9 MG/10ML-% IV SOSY
PREFILLED_SYRINGE | INTRAVENOUS | Status: DC | PRN
  Administered 2021-02-01: 10 mg via INTRAVENOUS
  Administered 2021-02-01: 5 mg via INTRAVENOUS

## 2021-02-01 MED ORDER — LACTATED RINGERS IV SOLN: INTRAVENOUS | Status: DC | PRN

## 2021-02-01 MED ORDER — PROPOFOL 1000 MG/100ML IV EMUL
INTRAVENOUS | Status: AC
  Filled 2021-02-01: qty 100

## 2021-02-01 MED ORDER — PROMETHAZINE HCL 25 MG/ML IJ SOLN: 6.2500 mg | INTRAMUSCULAR | Status: DC | PRN

## 2021-02-01 MED ORDER — KETAMINE HCL 50 MG/5ML IJ SOSY
PREFILLED_SYRINGE | INTRAMUSCULAR | Status: AC
  Filled 2021-02-01: qty 10

## 2021-02-01 MED ORDER — DEXMEDETOMIDINE (PRECEDEX) IN NS 20 MCG/5ML (4 MCG/ML) IV SYRINGE
PREFILLED_SYRINGE | INTRAVENOUS | Status: AC
  Filled 2021-02-01: qty 5

## 2021-02-01 MED ORDER — PHENYLEPHRINE HCL-NACL 10-0.9 MG/250ML-% IV SOLN
INTRAVENOUS | Status: AC
  Filled 2021-02-01: qty 250

## 2021-02-01 MED ORDER — OXYCODONE HCL 5 MG/5ML PO SOLN
5.0000 mg | Freq: Once | ORAL | Status: DC | PRN
Start: 2021-02-01 — End: 2021-02-01

## 2021-02-01 MED ORDER — ONDANSETRON HCL 4 MG/2ML IJ SOLN: 4.0000 mg | Freq: Four times a day (QID) | INTRAMUSCULAR | Status: DC | PRN

## 2021-02-01 MED ORDER — ONDANSETRON HCL 4 MG/2ML IJ SOLN
INTRAMUSCULAR | Status: DC | PRN
  Administered 2021-02-01: 4 mg via INTRAVENOUS

## 2021-02-01 MED ORDER — LIDOCAINE 2% (20 MG/ML) 5 ML SYRINGE
INTRAMUSCULAR | Status: DC | PRN
  Administered 2021-02-01: 60 mg via INTRAVENOUS

## 2021-02-01 MED ORDER — ACETAMINOPHEN 10 MG/ML IV SOLN
INTRAVENOUS | Status: DC | PRN
  Administered 2021-02-01: 1000 mg via INTRAVENOUS

## 2021-02-01 MED ORDER — THROMBIN 20000 UNITS EX SOLR: CUTANEOUS | Status: DC | PRN

## 2021-02-01 SURGICAL SUPPLY — 71 items
BASKET BONE COLLECTION (BASKET) ×2 IMPLANT
BENZOIN TINCTURE PRP APPL 2/3 (GAUZE/BANDAGES/DRESSINGS) ×2 IMPLANT
BLADE SURG 11 STRL SS (BLADE) ×2 IMPLANT
BONE VIVIGEN FORMABLE 10CC (Bone Implant) ×4 IMPLANT
BUR CUTTER 7.0 ROUND (BURR) ×2 IMPLANT
BUR MATCHSTICK NEURO 3.0 LAGG (BURR) ×2 IMPLANT
CAGE ALTERA 10X26 9-13 8D (Cage) ×2 IMPLANT
CANISTER SUCT 3000ML PPV (MISCELLANEOUS) ×2 IMPLANT
CAP LOCK DLX THRD (Cap) ×20 IMPLANT
CARTRIDGE OIL MAESTRO DRILL (MISCELLANEOUS) ×1 IMPLANT
CNTNR URN SCR LID CUP LEK RST (MISCELLANEOUS) ×1 IMPLANT
CONN CROSS LP 6.35X42.5-67 (Connector) ×2 IMPLANT
CONNECTOR CRS LP 6.35X42.5-67 (Connector) ×1 IMPLANT
CONT SPEC 4OZ STRL OR WHT (MISCELLANEOUS) ×2
COVER BACK TABLE 60X90IN (DRAPES) ×2 IMPLANT
DECANTER SPIKE VIAL GLASS SM (MISCELLANEOUS) ×2 IMPLANT
DERMABOND ADVANCED (GAUZE/BANDAGES/DRESSINGS) ×1
DERMABOND ADVANCED .7 DNX12 (GAUZE/BANDAGES/DRESSINGS) ×1 IMPLANT
DIFFUSER DRILL AIR PNEUMATIC (MISCELLANEOUS) ×2 IMPLANT
DRAPE C-ARM 42X72 X-RAY (DRAPES) ×2 IMPLANT
DRAPE C-ARMOR (DRAPES) IMPLANT
DRAPE HALF SHEET 40X57 (DRAPES) IMPLANT
DRAPE LAPAROTOMY 100X72X124 (DRAPES) ×2 IMPLANT
DRAPE SURG 17X23 STRL (DRAPES) ×2 IMPLANT
DRSG OPSITE 4X5.5 SM (GAUZE/BANDAGES/DRESSINGS) ×2 IMPLANT
DRSG OPSITE POSTOP 4X8 (GAUZE/BANDAGES/DRESSINGS) ×2 IMPLANT
DURAPREP 26ML APPLICATOR (WOUND CARE) ×2 IMPLANT
ELECT REM PT RETURN 9FT ADLT (ELECTROSURGICAL) ×2
ELECTRODE REM PT RTRN 9FT ADLT (ELECTROSURGICAL) ×1 IMPLANT
EVACUATOR 3/16  PVC DRAIN (DRAIN) ×2
EVACUATOR 3/16 PVC DRAIN (DRAIN) ×1 IMPLANT
GAUZE 4X4 16PLY RFD (DISPOSABLE) ×2 IMPLANT
GAUZE SPONGE 4X4 12PLY STRL (GAUZE/BANDAGES/DRESSINGS) ×2 IMPLANT
GAUZE SPONGE 4X4 12PLY STRL LF (GAUZE/BANDAGES/DRESSINGS) ×2 IMPLANT
GLOVE BIO SURGEON STRL SZ7 (GLOVE) ×4 IMPLANT
GLOVE BIO SURGEON STRL SZ8 (GLOVE) ×10 IMPLANT
GLOVE INDICATOR 8.5 STRL (GLOVE) ×4 IMPLANT
GLOVE SURG UNDER POLY LF SZ7 (GLOVE) ×4 IMPLANT
GLOVE SURG UNDER POLY LF SZ7.5 (GLOVE) ×4 IMPLANT
GOWN STRL REUS W/ TWL LRG LVL3 (GOWN DISPOSABLE) ×4 IMPLANT
GOWN STRL REUS W/ TWL XL LVL3 (GOWN DISPOSABLE) ×2 IMPLANT
GOWN STRL REUS W/TWL 2XL LVL3 (GOWN DISPOSABLE) IMPLANT
GOWN STRL REUS W/TWL LRG LVL3 (GOWN DISPOSABLE) ×8
GOWN STRL REUS W/TWL XL LVL3 (GOWN DISPOSABLE) ×4
GRAFT BONE PROTEIOS LRG 5CC (Orthopedic Implant) ×2 IMPLANT
KIT BASIN OR (CUSTOM PROCEDURE TRAY) ×2 IMPLANT
KIT POSITION SURG JACKSON T1 (MISCELLANEOUS) ×2 IMPLANT
KIT TURNOVER KIT B (KITS) ×2 IMPLANT
MILL MEDIUM DISP (BLADE) ×2 IMPLANT
NEEDLE HYPO 21X1.5 SAFETY (NEEDLE) ×4 IMPLANT
NEEDLE HYPO 25X1 1.5 SAFETY (NEEDLE) ×2 IMPLANT
NS IRRIG 1000ML POUR BTL (IV SOLUTION) ×4 IMPLANT
OIL CARTRIDGE MAESTRO DRILL (MISCELLANEOUS) ×2
PACK LAMINECTOMY NEURO (CUSTOM PROCEDURE TRAY) ×2 IMPLANT
PATTIES SURGICAL 1X1 (DISPOSABLE) ×2 IMPLANT
ROD CURVED TI 6.35X125 (Rod) ×4 IMPLANT
SCREW CREO DLX POLY 6.5X40 (Screw) ×8 IMPLANT
SCREW DLX CREO 5.5X45 (Screw) ×4 IMPLANT
SCREW PA CREO DLX 6.5X45 (Screw) ×8 IMPLANT
SPONGE SURGIFOAM ABS GEL 100 (HEMOSTASIS) ×2 IMPLANT
STRIP CLOSURE SKIN 1/2X4 (GAUZE/BANDAGES/DRESSINGS) ×2 IMPLANT
SUT VIC AB 0 CT1 18XCR BRD8 (SUTURE) ×1 IMPLANT
SUT VIC AB 0 CT1 8-18 (SUTURE) ×2
SUT VIC AB 2-0 CT1 18 (SUTURE) ×2 IMPLANT
SUT VIC AB 4-0 PS2 27 (SUTURE) ×2 IMPLANT
SYR 20ML LL LF (SYRINGE) ×2 IMPLANT
SYR 30ML LL (SYRINGE) ×2 IMPLANT
TOWEL GREEN STERILE (TOWEL DISPOSABLE) ×2 IMPLANT
TOWEL GREEN STERILE FF (TOWEL DISPOSABLE) ×2 IMPLANT
TRAY FOLEY MTR SLVR 16FR STAT (SET/KITS/TRAYS/PACK) ×2 IMPLANT
WATER STERILE IRR 1000ML POUR (IV SOLUTION) ×2 IMPLANT

## 2021-02-01 NOTE — Transfer of Care (Signed)
Immediate Anesthesia Transfer of Care Note  Patient: Cynthia Schaefer  Procedure(s) Performed: Lumbar Four-Five Posterior Lumbar Interbody Fusion with Lumbar Three-Four Decompression Pedicle Screw Fixation from Lumbar Two to Sacral One (N/A Spine Lumbar)  Patient Location: PACU  Anesthesia Type:General  Level of Consciousness: awake  Airway & Oxygen Therapy: Patient Spontanous Breathing and Patient connected to face mask oxygen  Post-op Assessment: Report given to RN and Post -op Vital signs reviewed and stable  Post vital signs: Reviewed and stable  Last Vitals:  Vitals Value Taken Time  BP 113/61 02/01/21 1354  Temp    Pulse 105 02/01/21 1358  Resp 17 02/01/21 1358  SpO2 99 % 02/01/21 1358  Vitals shown include unvalidated device data.  Last Pain:  Vitals:   02/01/21 0721  TempSrc: Oral  PainSc:       Patients Stated Pain Goal: 3 (02/01/21 0531)  Complications: No complications documented.

## 2021-02-01 NOTE — Op Note (Signed)
Preoperative diagnosis: Degenerative disc disease lumbar spinal stenosis degenerative scoliosis L2-S1 with a grade 1 spondylolisthesis L5-S1  Postoperative diagnosis: Same  Procedure: #1 decompressive laminectomy right-sided L3-4 with foraminotomies of the L3 nerve root and partial medial facetectomy  2.  Complete decompressive laminectomy L4-5 with complete medial facetectomies radical foraminotomies of the L4 and L5 nerve root in excess and requiring more work than would be needed with a standard interbody fusion  3.  Transforaminal interbody fusion L4-5 utilizing the globus Altera cage packed with locally harvested autograft mixed with Protios, vivgen and locally harvested autograft placement from the left side  4.  Pedicle screw fixation L2-S1 utilizing the globus Creo new quarter inch pedicle screw set with bilateral pedicle screws at L2, L3, L4, L5, and S1  5.  Posterior lateral fusion utilizing locally harvested autograft mixed at L2-3, L3-4, L4-5, and L5-S1 with aggressive decortication of the TPs and lateral facet joints  ^6.Open reduction spinal deformity L2-S1  Surgeon: Jillyn Hidden Zaiya Annunziato  Assistant: Ervin Knack  Anesthesia: General  EBL: 76  HPI: 60 year old female presents for the second stage of a two-stage procedure where on Monday she had undergone an L5-S1 ALIF and L2-3 and L3-4 anterior lateral interbody fusion and presents today on Wednesday for posterior approach pedicle screw fixation transforaminal interbody fusion at L4-5 with decompression at L3-4 and L4-5.  I extensively reviewed the risks and benefits of today's operation with her as well as perioperative course expectations of outcome and alternatives of surgery and she understood and agreed to proceed forward.  Operative procedure: Patient was brought into the OR was used and general anesthesia positioned prone on the [place] table her back was prepped and draped in routine sterile fashion midline incision was made  after infiltration of 10 cc lidocaine with epi and Bovie letter cautery was used take down subtenons tissue and subperiosteal dissection was carried out lamina of L1, L2, L3, L4, L5, and S1.  Identified the TPs from L2 down S1.  Intraoperative x-ray confirmed identification appropriate levels and spinous process at L4 was removed facets were drilled down complete decompression was begun with complete medial facetectomies were added from anatomies of the L4 and L5 nerve roots.  There was marked severe hourglass compression of thecal sac from marked facet arthropathy at this level.  Aggressive undermining of the 2 superior to collating facet especially in the patient's left side asked again allowed access to the lateral margin of disc base.  I then extended the laminotomy and decompressed L3-4 on the right side with with partial to near complete facetectomy and foraminotomies of the L3 nerve root.  Disc space was then cleaned out bilaterally and with sequential distraction and 11 distractor in place on the right side I prepared the endplates and sized up a 9-12 8 degree lordotic Altera cage this was packed with locally harvested autograft mixed and prior to cage deployment placement I packed extensive mount of autograft centrally and laterally then deployed the case cage across the midline and opened it up under fluoroscopy creating lordosis of the L4-5 disc space.  After the MRI work been done here under fluoroscopy but utilizing both AP and lateral fluoroscopy pedicle screws were placed at L2, L3, L4, L5 and S1 bilaterally all screws had excellent purchase fluoroscopy confirmed good position of all the implants with significant reduction of her deformity with placement at L4-5 as well as the additional interbody work.  Then aggressively irrigated the wound and aggressively decorticated the TPs and lateral gutters  at L2-3, L3-4, L4-5, L5-S1 packed extensive mount autograft along those levels.  Then incised up to rods  contoured anchored them and placed from L2 down to S1 anchored all the knots in place there as well.  Did place a cross-link after exploring all the foramina to confirm patency then placed a large Hemovac drain injected Exparel in the fascia and closed the wound in layers with opted Vicryl in a running 4 subcuticular and skin Dermabond benzoin Steri-Strips and a sterile dressing was applied patient recovery in stable condition.  At the end the case all needle count sponge counts were correct.

## 2021-02-01 NOTE — Anesthesia Preprocedure Evaluation (Addendum)
Anesthesia Evaluation  Patient identified by MRN, date of birth, ID band Patient awake    Reviewed: Allergy & Precautions, NPO status , Patient's Chart, lab work & pertinent test results  Airway Mallampati: II  TM Distance: >3 FB Neck ROM: Full    Dental no notable dental hx.    Pulmonary neg pulmonary ROS,    Pulmonary exam normal breath sounds clear to auscultation       Cardiovascular hypertension, Pt. on medications Normal cardiovascular exam Rhythm:Regular Rate:Normal  ECG: NSR   Neuro/Psych PSYCHIATRIC DISORDERS Depression negative neurological ROS     GI/Hepatic negative GI ROS, Neg liver ROS,   Endo/Other  Hypothyroidism   Renal/GU negative Renal ROS     Musculoskeletal negative musculoskeletal ROS (+)   Abdominal (+) + obese,   Peds  Hematology  (+) anemia , HLD   Anesthesia Other Findings Spinal stenosis of lumbar region with neurogenic claudication  Reproductive/Obstetrics                            Anesthesia Physical Anesthesia Plan  ASA: II  Anesthesia Plan: General   Post-op Pain Management:    Induction: Intravenous  PONV Risk Score and Plan: 3 and Ondansetron, Dexamethasone, Midazolam and Treatment may vary due to age or medical condition  Airway Management Planned: Oral ETT  Additional Equipment:   Intra-op Plan:   Post-operative Plan: Extubation in OR  Informed Consent: I have reviewed the patients History and Physical, chart, labs and discussed the procedure including the risks, benefits and alternatives for the proposed anesthesia with the patient or authorized representative who has indicated his/her understanding and acceptance.     Dental advisory given  Plan Discussed with: CRNA  Anesthesia Plan Comments:        Anesthesia Quick Evaluation

## 2021-02-01 NOTE — Progress Notes (Signed)
Subjective: Patient reports Improved back pain doing well this morning  Objective: Vital signs in last 24 hours: Temp:  [98 F (36.7 C)-100.3 F (37.9 C)] 100.3 F (37.9 C) (02/16 0721) Pulse Rate:  [80-100] 96 (02/16 0721) Resp:  [16-20] 16 (02/16 0721) BP: (108-121)/(59-66) 121/62 (02/16 0721) SpO2:  [85 %-95 %] 94 % (02/16 0721)  Intake/Output from previous day: 02/15 0701 - 02/16 0700 In: -  Out: 2750 [Urine:2750] Intake/Output this shift: No intake/output data recorded.  Strength 5-5 iliopsoas, quads, hamstrings, gastroc, into tibialis, EHL.  Lab Results: Recent Labs    01/30/21 0931 01/30/21 1215  HGB 10.2* 10.9*  HCT 30.0* 32.0*   BMET Recent Labs    01/30/21 0931 01/30/21 1215  NA 140 139  K 3.0* 3.4*    Studies/Results: DG Lumbar Spine 2-3 Views  Result Date: 01/30/2021 CLINICAL DATA:  Multilevel lumbar fusion EXAM: LUMBAR SPINE - 2-3 VIEW; DG C-ARM 1-60 MIN COMPARISON:  Intraoperative film from earlier in the same day. FLUOROSCOPY TIME:  Radiation Exposure Index (as provided by the fluoroscopic device): 212.6 mGy If the device does not provide the exposure index: Fluoroscopy Time:  2 minutes 53 seconds Number of Acquired Images:  3 FINDINGS: Initial images again demonstrate prior fusion at L5-S1. Subsequent L2-3 and L3-4 lateral fusion was performed. No retained foreign bodies are identified. IMPRESSION: No evidence of retained foreign body on lumbar fusion spot films. Critical Value/emergent results were called by telephone at the time of interpretation on 01/30/2021 at 12:57 pm to OR 20, who verbally acknowledged these results. Electronically Signed   By: Alcide Clever M.D.   On: 01/30/2021 13:03   DG C-Arm 1-60 Min  Result Date: 01/30/2021 CLINICAL DATA:  Multilevel lumbar fusion EXAM: LUMBAR SPINE - 2-3 VIEW; DG C-ARM 1-60 MIN COMPARISON:  Intraoperative film from earlier in the same day. FLUOROSCOPY TIME:  Radiation Exposure Index (as provided by the  fluoroscopic device): 212.6 mGy If the device does not provide the exposure index: Fluoroscopy Time:  2 minutes 53 seconds Number of Acquired Images:  3 FINDINGS: Initial images again demonstrate prior fusion at L5-S1. Subsequent L2-3 and L3-4 lateral fusion was performed. No retained foreign bodies are identified. IMPRESSION: No evidence of retained foreign body on lumbar fusion spot films. Critical Value/emergent results were called by telephone at the time of interpretation on 01/30/2021 at 12:57 pm to OR 20, who verbally acknowledged these results. Electronically Signed   By: Alcide Clever M.D.   On: 01/30/2021 13:03   DG OR LOCAL ABDOMEN  Result Date: 01/30/2021 CLINICAL DATA:  Intraoperative evaluation for retained instrument during anterior lumbar fixation EXAM: OR LOCAL ABDOMEN COMPARISON:  None. FINDINGS: Scattered large and small bowel gas is noted. Changes consistent with the known operative fusion are seen. No retained foreign body is noted. IMPRESSION: No evidence of retained foreign body. Critical Value/emergent results were called by telephone at the time of interpretation on 01/30/2021 at 10:34 am to Sue Lush in OR 20 , who verbally acknowledged these results. Electronically Signed   By: Alcide Clever M.D.   On: 01/30/2021 10:40    Assessment/Plan: Postop day 2 phase 1 interbody fusions presents today for L4-5 transforaminal interbody fusion decompression L3-4 in addition with pedicle screw fixation L2-S1  LOS: 2 days     Mariam Dollar 02/01/2021, 7:52 AM

## 2021-02-01 NOTE — Progress Notes (Signed)
PT Cancellation Note  Patient Details Name: JNAI SNELLGROVE MRN: [id number] DOB: 26-Feb-1961   Cancelled Treatment:    Reason Eval/Treat Not Completed: Patient at procedure or test/unavailable still in PACU per Epic, not available for PT. Will attempt on next date of service at this point.    Madelaine Etienne, DPT, PN1   Supplemental Physical Therapist [facility]    Pager [phone] Acute Rehab Office [phone]

## 2021-02-01 NOTE — Anesthesia Procedure Notes (Signed)
Procedure Name: Intubation Date/Time: 02/01/2021 8:56 AM Performed by: Genelle Bal, CRNA Pre-anesthesia Checklist: Patient identified, Emergency Drugs available, Suction available and Patient being monitored Patient Re-evaluated:Patient Re-evaluated prior to induction Oxygen Delivery Method: Circle system utilized Preoxygenation: Pre-oxygenation with 100% oxygen Induction Type: IV induction Ventilation: Mask ventilation without difficulty and Oral airway inserted - appropriate to patient size Laryngoscope Size: Mac and 3 Grade View: Grade I Tube type: Oral Number of attempts: 1 Airway Equipment and Method: Stylet and Oral airway Placement Confirmation: ETT inserted through vocal cords under direct vision,  positive ETCO2 and breath sounds checked- equal and bilateral Secured at: 21 cm Tube secured with: Tape Dental Injury: Teeth and Oropharynx as per pre-operative assessment  Comments: Atraumatic oral intubation by Dorene Grebe, SRNA.

## 2021-02-01 NOTE — Anesthesia Postprocedure Evaluation (Signed)
Anesthesia Post Note  Patient: Cynthia Schaefer  Procedure(s) Performed: Lumbar Four-Five Posterior Lumbar Interbody Fusion with Lumbar Three-Four Decompression Pedicle Screw Fixation from Lumbar Two to Sacral One (N/A Spine Lumbar)     Patient location during evaluation: PACU Anesthesia Type: General Level of consciousness: awake Pain management: pain level controlled Vital Signs Assessment: post-procedure vital signs reviewed and stable Respiratory status: spontaneous breathing, nonlabored ventilation, respiratory function stable and patient connected to nasal cannula oxygen Cardiovascular status: blood pressure returned to baseline and stable Postop Assessment: no apparent nausea or vomiting Anesthetic complications: no   No complications documented.  Last Vitals:  Vitals:   02/01/21 1525 02/01/21 1554  BP: 124/68 124/70  Pulse: 88 96  Resp: 20 18  Temp:  36.7 C  SpO2: 94% 99%    Last Pain:  Vitals:   02/01/21 1600  TempSrc:   PainSc: 3                  Shantanu Strauch P Kentley Blyden

## 2021-02-02 ENCOUNTER — Encounter (HOSPITAL_COMMUNITY): Payer: Self-pay | Admitting: Neurosurgery

## 2021-02-02 LAB — BASIC METABOLIC PANEL
Anion gap: 6 (ref 5–15)
BUN: 6 mg/dL — ABNORMAL LOW (ref 8–23)
CO2: 28 mmol/L (ref 22–32)
Calcium: 8 mg/dL — ABNORMAL LOW (ref 8.9–10.3)
Chloride: 106 mmol/L (ref 98–111)
Creatinine, Ser: 0.76 mg/dL (ref 0.44–1.00)
GFR, Estimated: >60 mL/min (ref >60)
Glucose, Bld: 112 mg/dL — ABNORMAL HIGH (ref 70–99)
Potassium: 3.8 mmol/L (ref 3.5–5.1)
Sodium: 140 mmol/L (ref 135–145)

## 2021-02-02 LAB — CBC WITH DIFFERENTIAL/PLATELET
Abs Immature Granulocytes: 0.08 10*3/uL — ABNORMAL HIGH (ref 0.00–0.07)
Basophils Absolute: 0 10*3/uL (ref 0.0–0.1)
Basophils Relative: 0 %
Eosinophils Absolute: 0.1 10*3/uL (ref 0.0–0.5)
Eosinophils Relative: 1 %
HCT: 21.6 % — ABNORMAL LOW (ref 36.0–46.0)
Hemoglobin: 7.2 g/dL — ABNORMAL LOW (ref 12.0–15.0)
Immature Granulocytes: 1 %
Lymphocytes Relative: 23 %
Lymphs Abs: 3.2 10*3/uL (ref 0.7–4.0)
MCH: 32 pg (ref 26.0–34.0)
MCHC: 33.3 g/dL (ref 30.0–36.0)
MCV: 96 fL (ref 80.0–100.0)
Monocytes Absolute: 1.3 10*3/uL — ABNORMAL HIGH (ref 0.1–1.0)
Monocytes Relative: 10 %
Neutro Abs: 9 10*3/uL — ABNORMAL HIGH (ref 1.7–7.7)
Neutrophils Relative %: 65 %
Platelets: 150 10*3/uL (ref 150–400)
RBC: 2.25 MIL/uL — ABNORMAL LOW (ref 3.87–5.11)
RDW: 13.4 % (ref 11.5–15.5)
WBC: 13.6 10*3/uL — ABNORMAL HIGH (ref 4.0–10.5)
nRBC: 0 % (ref 0.0–0.2)

## 2021-02-02 LAB — PREPARE RBC (CROSSMATCH)

## 2021-02-02 LAB — CBC
HCT: 27.9 % — ABNORMAL LOW (ref 36.0–46.0)
Hemoglobin: 9 g/dL — ABNORMAL LOW (ref 12.0–15.0)
MCH: 30.4 pg (ref 26.0–34.0)
MCHC: 32.3 g/dL (ref 30.0–36.0)
MCV: 94.3 fL (ref 80.0–100.0)
Platelets: 131 10*3/uL — ABNORMAL LOW (ref 150–400)
RBC: 2.96 MIL/uL — ABNORMAL LOW (ref 3.87–5.11)
RDW: 14.7 % (ref 11.5–15.5)
WBC: 14.4 10*3/uL — ABNORMAL HIGH (ref 4.0–10.5)
nRBC: 0 % (ref 0.0–0.2)

## 2021-02-02 MED ORDER — SODIUM CHLORIDE 0.9 % IV BOLUS
1000.0000 mL | Freq: Once | INTRAVENOUS | Status: AC
  Administered 2021-02-02: 1000 mL via INTRAVENOUS

## 2021-02-02 MED ORDER — PANTOPRAZOLE SODIUM 40 MG PO TBEC
40.0000 mg | DELAYED_RELEASE_TABLET | Freq: Every day | ORAL | Status: DC
  Administered 2021-02-02 – 2021-02-03 (×2): 40 mg via ORAL
  Filled 2021-02-02 (×2): qty 1

## 2021-02-02 MED ORDER — SODIUM CHLORIDE 0.9% IV SOLUTION: Freq: Once | INTRAVENOUS | Status: DC

## 2021-02-02 MED FILL — Heparin Sodium (Porcine) Inj 1000 Unit/ML: INTRAMUSCULAR | Qty: 30 | Status: AC

## 2021-02-02 MED FILL — Sodium Chloride IV Soln 0.9%: INTRAVENOUS | Qty: 1000 | Status: AC

## 2021-02-02 NOTE — Progress Notes (Signed)
Subjective: Patient reports Doing well condition of back pain no leg pain  Objective: Vital signs in last 24 hours: Temp:  [97.2 F (36.2 C)-99 F (37.2 C)] 98.2 F (36.8 C) (02/17 0744) Pulse Rate:  [84-100] 94 (02/17 0744) Resp:  [10-34] 18 (02/17 0744) BP: (88-124)/(58-71) 88/62 (02/17 0744) SpO2:  [93 %-100 %] 97 % (02/17 0744)  Intake/Output from previous day: 02/16 0701 - 02/17 0700 In: 2540 [I.V.:1700; IV Piggyback:840] Out: 3440 [Urine:2355; Drains:685; Blood:400] Intake/Output this shift: No intake/output data recorded.  Strength 5/5 wound clean dry and intact  Lab Results: Recent Labs    01/30/21 0931 01/30/21 1215  HGB 10.2* 10.9*  HCT 30.0* 32.0*   BMET Recent Labs    01/30/21 0931 01/30/21 1215  NA 140 139  K 3.0* 3.4*    Studies/Results: DG Lumbar Spine 2-3 Views  Result Date: 02/01/2021 CLINICAL DATA:  L2 through S1 fusion. EXAM: DG C-ARM 1-60 MIN; LUMBAR SPINE - 2-3 VIEW FLUOROSCOPY TIME:  Fluoroscopy Time:  2 minutes and 4 seconds. COMPARISON:  04/29/2021. FINDINGS: Two C-arm fluoroscopic images were obtained intraoperatively and submitted for post operative interpretation. These images demonstrate posterior pedicle screws at L2 through S1 with intervening cages. Prior fusion at L5-S1. Please see the performing provider's procedural report for further detail. IMPRESSION: Intraoperative fluoroscopy, as detailed above. Electronically Signed   By: Feliberto Harts MD   On: 02/01/2021 14:54   DG C-Arm 1-60 Min  Result Date: 02/01/2021 CLINICAL DATA:  L2 through S1 fusion. EXAM: DG C-ARM 1-60 MIN; LUMBAR SPINE - 2-3 VIEW FLUOROSCOPY TIME:  Fluoroscopy Time:  2 minutes and 4 seconds. COMPARISON:  04/29/2021. FINDINGS: Two C-arm fluoroscopic images were obtained intraoperatively and submitted for post operative interpretation. These images demonstrate posterior pedicle screws at L2 through S1 with intervening cages. Prior fusion at L5-S1. Please see the  performing provider's procedural report for further detail. IMPRESSION: Intraoperative fluoroscopy, as detailed above. Electronically Signed   By: Feliberto Harts MD   On: 02/01/2021 14:54    Assessment/Plan: Postop day three anterior stage postop day one posterior stage patient doing very well condition of back pain no leg pain blood pressure is little bit saggy will check a CBC BMP give her a liter bolus of saline.  Drain output also little bit high.  Mobilize with physical Occupational Therapy  LOS: 3 days     Mariam Dollar 02/02/2021, 7:49 AM

## 2021-02-02 NOTE — Progress Notes (Signed)
Physical Therapy Treatment Patient Details Name: Cynthia Schaefer MRN: [id number] DOB: March 21, 1961 Today's Date: 02/02/2021    History of Present Illness Pt is a 60 y/o female s/p L5-S1 ALIF, L2-3 L3-4 anterior lateral interbody fusions. Pt for second part of surgery on 2/16. PMH includes HTN and scoliosis.    PT Comments    Pt progressing well with post-op mobility. She was able to demonstrate transfers and ambulation with gross min guard assist to min assist and RW for support. Pt was educated on precautions, brace application/wearing schedule, appropriate activity progression, and car transfer. Will continue to follow.      Follow Up Recommendations  No PT follow up;Supervision for mobility/OOB     Equipment Recommendations  Rolling walker with 5" wheels;3in1 (PT)    Recommendations for Other Services       Precautions / Restrictions Precautions Precautions: Back Precaution Booklet Issued: Yes (comment) Precaution Comments: Pt was cued for precautions during functional mobility. Required Braces or Orthoses: Spinal Brace Spinal Brace: Lumbar corset Restrictions Weight Bearing Restrictions: No    Mobility  Bed Mobility Overal bed mobility: Needs Assistance Bed Mobility: Sit to Sidelying;Rolling Rolling: Supervision       Sit to sidelying: Min assist General bed mobility comments: Light min assist to elevate LE's up onto EOB.    Transfers Overall transfer level: Needs assistance Equipment used: None Transfers: Sit to/from Stand Sit to Stand: Min guard         General transfer comment: Min guard A for safety. Increased time required.  Ambulation/Gait Ambulation/Gait assistance: Min guard Gait Distance (Feet): 75 Feet Assistive device: IV Pole Gait Pattern/deviations: Step-through pattern;Decreased stride length;Shuffle Gait velocity: Decreased Gait velocity interpretation: <1.31 ft/sec, indicative of household ambulator General Gait Details: Slow and  guarded but without overt LOB. Was able to make a minor adjustment to gait speed with cues but was not able to maintain long before slowing back down.   Stairs             Wheelchair Mobility    Modified Rankin (Stroke Patients Only)       Balance Overall balance assessment: Needs assistance Sitting-balance support: Feet supported;No upper extremity supported Sitting balance-Leahy Scale: Good     Standing balance support: No upper extremity supported;Single extremity supported Standing balance-Leahy Scale: Fair Standing balance comment: Able to maintain static standing without UE support                            Cognition Arousal/Alertness: Awake/alert Behavior During Therapy: WFL for tasks assessed/performed;Flat affect Overall Cognitive Status: No family/caregiver present to determine baseline cognitive functioning                                 General Comments: Pt slow to respond at times, but may be due to medications.      Exercises      General Comments        Pertinent Vitals/Pain Pain Assessment: Faces Faces Pain Scale: Hurts even more Pain Location: back Pain Descriptors / Indicators: Aching;Operative site guarding Pain Intervention(s): Limited activity within patient's tolerance;Monitored during session;Repositioned    Home Living                      Prior Function            PT Goals (current goals can now be found in the  care plan section) Acute Rehab PT Goals Patient Stated Goal: to decrease pain PT Goal Formulation: With patient Time For Goal Achievement: 02/14/21 Potential to Achieve Goals: Good Progress towards PT goals: Progressing toward goals    Frequency    Min 5X/week      PT Plan Current plan remains appropriate    Co-evaluation              AM-PAC PT "6 Clicks" Mobility   Outcome Measure  Help needed turning from your back to your side while in a flat bed without using  bedrails?: A Little Help needed moving from lying on your back to sitting on the side of a flat bed without using bedrails?: A Little Help needed moving to and from a bed to a chair (including a wheelchair)?: A Little Help needed standing up from a chair using your arms (e.g., wheelchair or bedside chair)?: A Little Help needed to walk in hospital room?: A Little Help needed climbing 3-5 steps with a railing? : A Lot 6 Click Score: 17    End of Session Equipment Utilized During Treatment: Gait belt;Back brace Activity Tolerance: Patient limited by pain Patient left: in bed;with call bell/phone within reach Nurse Communication: Mobility status PT Visit Diagnosis: Unsteadiness on feet (R26.81);Muscle weakness (generalized) (M62.81);Pain Pain - part of body:  (back)     Time: 9163-8466 PT Time Calculation (min) (ACUTE ONLY): 19 min  Charges:  $Gait Training: 8-22 mins                     Cynthia Schaefer, PT, DPT Acute Rehabilitation Services Pager: [phone] Office: [phone]    Cynthia Schaefer 02/02/2021, 1:31 PM

## 2021-02-02 NOTE — Progress Notes (Addendum)
  Progress Note    02/02/2021 8:06 AM 1 Day Post-Op  Subjective:  Sitting up on side of bed. Expected soreness. Voiding spontaneously. Tolerating diet without N or V   Vitals:   02/02/21 0305 02/02/21 0744  BP: 113/64 (!) 88/62  Pulse: 86 94  Resp: 18 18  Temp: 98.4 F (36.9 C) 98.2 F (36.8 C)  SpO2: 98% 97%    Physical Exam: General appearance: Awake, alert in no apparent distress Cardiac: Heart rate and rhythm are regular Respirations: Nonlabored Incisions: Left lower quadrant incision is well approximated without drainage or erythema. Extremities: Both feet are warm with intact sensation and motor function.      CBC    Component Value Date/Time   WBC 8.5 01/26/2021 1341   RBC 4.46 01/26/2021 1341   HGB 10.9 (L) 01/30/2021 1215   HCT 32.0 (L) 01/30/2021 1215   PLT 263 01/26/2021 1341   MCV 95.3 01/26/2021 1341   MCH 30.5 01/26/2021 1341   MCHC 32.0 01/26/2021 1341   RDW 13.7 01/26/2021 1341    BMET    Component Value Date/Time   NA 139 01/30/2021 1215   K 3.4 (L) 01/30/2021 1215   CL 105 01/26/2021 1341   CO2 25 01/26/2021 1341   GLUCOSE 88 01/26/2021 1341   BUN 9 01/26/2021 1341   CREATININE 0.78 01/26/2021 1341   CALCIUM 8.8 (L) 01/26/2021 1341   GFRNONAA >60 01/26/2021 1341     Intake/Output Summary (Last 24 hours) at 02/02/2021 0806 Last data filed at 02/02/2021 0534 Gross per 24 hour  Intake 2540 ml  Output 3440 ml  Net -900 ml    HOSPITAL MEDICATIONS Scheduled Meds: . amLODipine  5 mg Oral Daily  . levothyroxine  100 mcg Oral Q0600  . pantoprazole (PROTONIX) IV  40 mg Intravenous QHS  . rosuvastatin  5 mg Oral Daily  . sodium chloride flush  3 mL Intravenous Q12H  . venlafaxine XR  225 mg Oral Q breakfast   Continuous Infusions: . sodium chloride    .  ceFAZolin (ANCEF) IV 2 g (02/02/21 0534)  . sodium chloride     PRN Meds:.acetaminophen **OR** acetaminophen, alum & mag hydroxide-simeth, cyclobenzaprine, HYDROmorphone (DILAUDID)  injection, menthol-cetylpyridinium **OR** phenol, ondansetron **OR** ondansetron (ZOFRAN) IV, oxyCODONE, sodium chloride flush  Assessment and Plan: POD#2 s/p L5-S1 ALIF. Progressing well.    Wendi Maya, PA-C [facility] [phone] 02/02/2021  8:06 AM   I have seen and evaluated the patient. I agree with the PA note as documented above.  Status post L5-S1 ALIF on Monday.  Has had additional posterior instrumentation yesterday.  Seems to be making good progress from my standpoint  Cephus Shelling, MD [facility]: [phone]

## 2021-02-02 NOTE — Evaluation (Signed)
Occupational Therapy Evaluation Patient Details Name: Cynthia Schaefer MRN: [id number] DOB: [birth year] Today's Date: 02/02/2021    History of Present Illness Pt is a 60 y/o female s/p L5-S1 ALIF, L2-3 L3-4 anterior lateral interbody fusions. Pt for second part of surgery on 2/16. PMH includes HTN and scoliosis.   Clinical Impression   PTA, pt was living with her husband and was independent. Currently, pt requires Min A for UB ADLs, Min Guard A for LB ADLs with AE, and Min Guard A for functional mobility using RW. Provided education and handout on back precautions, brace management, LB ADLs with AE, toileting, and functional transfer; pt demonstrated understanding. Pt would benefit from further acute OT to facilitate safe dc and address LB ADLs and tub transfer. Recommend dc to home with HHOT for further OT to optimize safety, independence with ADLs, and return to PLOF.     Follow Up Recommendations  Supervision/Assistance - 24 hour;Home health OT (May progress to no HH needs)    Equipment Recommendations  3 in 1 bedside commode    Recommendations for Other Services PT consult     Precautions / Restrictions Precautions Precautions: Back Precaution Booklet Issued: Yes (comment) Precaution Comments: Reviewed back precautions with pt. Required Braces or Orthoses: Spinal Brace Spinal Brace: Lumbar corset      Mobility Bed Mobility               General bed mobility comments: Sitting at EOB upon arrival    Transfers Overall transfer level: Needs assistance Equipment used: None Transfers: Sit to/from Stand Sit to Stand: Min guard         General transfer comment: Min guard A for safety. Increased time required.    Balance Overall balance assessment: Needs assistance Sitting-balance support: Feet supported;No upper extremity supported Sitting balance-Leahy Scale: Good     Standing balance support: No upper extremity supported;Single extremity supported Standing  balance-Leahy Scale: Fair Standing balance comment: Able to maintain static standing without UE support                           ADL either performed or assessed with clinical judgement   ADL Overall ADL's : Needs assistance/impaired Eating/Feeding: Set up;Sitting   Grooming: Set up;Supervision/safety;Sitting   Upper Body Bathing: Min guard;Sitting   Lower Body Bathing: Min guard;Sit to/from stand   Upper Body Dressing : Minimal assistance;Sitting Upper Body Dressing Details (indicate cue type and reason): Min A for positioning of brace Lower Body Dressing: Min guard;Sit to/from stand;With adaptive equipment Lower Body Dressing Details (indicate cue type and reason): Providing education on use of reacher for donning pants/underwear. Pt donning underwear with Min guard A Toilet Transfer: Min guard;Ambulation     Toileting - Clothing Manipulation Details (indicate cue type and reason): Educating pt on tiolt hygiene techniques     Functional mobility during ADLs: Min guard;Rolling walker General ADL Comments: Providing education on LB ADLs, toileting, brace management, and functional transfer. Pt requiring increased time throughout.     Vision         Perception     Praxis      Pertinent Vitals/Pain Pain Assessment: Faces Faces Pain Scale: Hurts even more Pain Location: back Pain Descriptors / Indicators: Aching;Operative site guarding Pain Intervention(s): Monitored during session;Limited activity within patient's tolerance;Repositioned     Hand Dominance Right   Extremity/Trunk Assessment Upper Extremity Assessment Upper Extremity Assessment: Overall WFL for tasks assessed   Lower Extremity Assessment Lower  Extremity Assessment: Defer to PT evaluation   Cervical / Trunk Assessment Cervical / Trunk Assessment: Other exceptions Cervical / Trunk Exceptions: s/p lumbar surgery   Communication Communication Communication: No difficulties   Cognition  Arousal/Alertness: Suspect due to medications;Awake/alert Behavior During Therapy: [facility] for tasks assessed/performed;Flat affect (Feel this is close to baseline affect) Overall Cognitive Status: No family/caregiver present to determine baseline cognitive functioning                                 General Comments: Pt slow to respond at times, but may be due to medications.   General Comments       Exercises     Shoulder Instructions      Home Living Family/patient expects to be discharged to:: Private residence Living Arrangements: Spouse/significant other Available Help at Discharge: Family;Available 24 hours/day Type of Home: House Home Access: Stairs to enter [employer] of Steps: 2 Entrance Stairs-Rails: Right;Left;Can reach both Home Layout: One level     Bathroom Shower/Tub: Chief Strategy Officer: Handicapped height ("chair height")     Home Equipment: Shower seat          Prior Functioning/Environment Level of Independence: Independent                 OT Problem List: Decreased strength;Decreased range of motion;Decreased activity tolerance;Impaired balance (sitting and/or standing);Decreased knowledge of use of DME or AE;Decreased safety awareness;Decreased cognition;Decreased coordination      OT Treatment/Interventions: Self-care/ADL training;Therapeutic exercise;Energy conservation;DME and/or AE instruction;Therapeutic activities;Patient/family education    OT Goals(Current goals can be found in the care plan section) Acute Rehab OT Goals Patient Stated Goal: to decrease pain OT Goal Formulation: With patient Time For Goal Achievement: 02/16/21 Potential to Achieve Goals: Good  OT Frequency: Min 2X/week   Barriers to D/C:            Co-evaluation              AM-PAC OT "6 Clicks" Daily Activity     Outcome Measure Help from another person eating meals?: None Help from another person taking care of  personal grooming?: A Little Help from another person toileting, which includes using toliet, bedpan, or urinal?: A Little Help from another person bathing (including washing, rinsing, drying)?: A Little Help from another person to put on and taking off regular upper body clothing?: A Little Help from another person to put on and taking off regular lower body clothing?: A Little 6 Click Score: 19   End of Session Equipment Utilized During Treatment: Back brace;Rolling walker Nurse Communication: Mobility status  Activity Tolerance: Patient tolerated treatment well Patient left: Other (comment) (with PT in hallway)  OT Visit Diagnosis: Unsteadiness on feet (R26.81);Other abnormalities of gait and mobility (R26.89);Muscle weakness (generalized) (M62.81);Pain Pain - part of body:  (Back)                Time: 1950-9326 OT Time Calculation (min): 21 min Charges:  OT General Charges $OT Visit: 1 Visit OT Evaluation $OT Eval Low Complexity: 1 Low  Senia Even MSOT, OTR/L Acute Rehab Pager: [phone] Office: [phone]  Theodoro Grist Edelyn Heidel 02/02/2021, 9:11 AM

## 2021-02-03 LAB — BPAM RBC
Blood Product Expiration Date: 202203112359
Blood Product Expiration Date: 202203112359
ISSUE DATE / TIME: 202202171314
ISSUE DATE / TIME: 202202171721
Unit Type and Rh: 6200
Unit Type and Rh: 6200

## 2021-02-03 LAB — CBC WITH DIFFERENTIAL/PLATELET
Abs Immature Granulocytes: 0.07 10*3/uL (ref 0.00–0.07)
Basophils Absolute: 0.1 10*3/uL (ref 0.0–0.1)
Basophils Relative: 1 %
Eosinophils Absolute: 0.3 10*3/uL (ref 0.0–0.5)
Eosinophils Relative: 3 %
HCT: 26.8 % — ABNORMAL LOW (ref 36.0–46.0)
Hemoglobin: 9 g/dL — ABNORMAL LOW (ref 12.0–15.0)
Immature Granulocytes: 1 %
Lymphocytes Relative: 21 %
Lymphs Abs: 2.2 10*3/uL (ref 0.7–4.0)
MCH: 31.1 pg (ref 26.0–34.0)
MCHC: 33.6 g/dL (ref 30.0–36.0)
MCV: 92.7 fL (ref 80.0–100.0)
Monocytes Absolute: 0.9 10*3/uL (ref 0.1–1.0)
Monocytes Relative: 8 %
Neutro Abs: 7 10*3/uL (ref 1.7–7.7)
Neutrophils Relative %: 66 %
Platelets: 169 10*3/uL (ref 150–400)
RBC: 2.89 MIL/uL — ABNORMAL LOW (ref 3.87–5.11)
RDW: 14.8 % (ref 11.5–15.5)
WBC: 10.5 10*3/uL (ref 4.0–10.5)
nRBC: 0 % (ref 0.0–0.2)

## 2021-02-03 LAB — TYPE AND SCREEN
ABO/RH(D): A POS
Antibody Screen: NEGATIVE
Unit division: 0
Unit division: 0

## 2021-02-03 MED ORDER — HYDROCODONE-ACETAMINOPHEN 10-325 MG PO TABS
1.0000 | ORAL_TABLET | ORAL | Status: DC | PRN
  Administered 2021-02-03 – 2021-02-04 (×4): 1 via ORAL
  Filled 2021-02-03 (×4): qty 1

## 2021-02-03 MED ORDER — METHOCARBAMOL 500 MG PO TABS: 500.0000 mg | ORAL_TABLET | Freq: Four times a day (QID) | ORAL | Status: DC | PRN

## 2021-02-03 MED ORDER — METHOCARBAMOL 500 MG PO TABS: 500.0000 mg | ORAL_TABLET | Freq: Four times a day (QID) | ORAL | 0 refills | Status: AC

## 2021-02-03 MED ORDER — OXYCODONE-ACETAMINOPHEN 10-325 MG PO TABS
1.0000 | ORAL_TABLET | Freq: Four times a day (QID) | ORAL | 0 refills | Status: DC | PRN
Start: 2021-02-03 — End: 2021-02-04

## 2021-02-03 NOTE — TOC Progression Note (Signed)
Transition of Care [facility]) - Progression Note    Patient Details  Name: Cynthia Schaefer MRN: [id number] Date of Birth: November 01, 1961  Transition of [facility] CM/SW Contact  Beckie Busing, RN Phone Number: [phone]  02/03/2021, 1:50 PM  Clinical Narrative:    CM received message from primary nurse to request CM come speak with the patient. CM at bedside with patient and sister. Sister states that she is upset because she was told by the MD that patient would not be discharged until Sat or Sun and nurse came in at 1pm and stated that patient would be discharging today. Sister states that she has no problem taking patient home but only if the patient has a detailed care plan because the sister is concerned due to low Hgb and electrolytes being out of wack. Sister also states that patient has  a lot of blood loss with 12 hour surgery and that she is very uncomfortable taking her home with no way to monitor her blood levels and electrolytes.Sister states that she does not want CM to set up [facility] therapy because the patient is not suppose to participate in therapy right now. Sister states that she is most concerned with the patients safety if she is discharged today. Bedside nurse has been made aware and CM's understanding is that the PA will come to bedside to speak with the patient and sister. TOC will continue to follow.         Expected Discharge Plan and Services                                                 Social Determinants of Health (SDOH) Interventions    Readmission Risk Interventions No flowsheet data found.

## 2021-02-03 NOTE — Progress Notes (Signed)
Physical Therapy Treatment Patient Details Name: Cynthia Schaefer MRN: [id number] DOB: Dec 28, 1960 Today's Date: 02/03/2021    History of Present Illness Pt is a 60 y/o female s/p L5-S1 ALIF, L2-3 L3-4 anterior lateral interbody fusions. Pt for second part of surgery on 2/16. PMH includes HTN and scoliosis.    PT Comments    Pt progressing well with post-op mobility. She was able to demonstrate transfers and ambulation with gross min guard assist and RW for support. Overall pt mobilizing very slowly and continues to be flat in affect. Pt was educated on precautions, brace application/wearing schedule, appropriate activity progression, and car transfer. Will continue to follow.      Follow Up Recommendations  No PT follow up;Supervision for mobility/OOB     Equipment Recommendations  Rolling walker with 5" wheels;3in1 (PT)    Recommendations for Other Services       Precautions / Restrictions Precautions Precautions: Back Precaution Booklet Issued: Yes (comment) Precaution Comments: Reviewed back precautions Required Braces or Orthoses: Spinal Brace Spinal Brace: Lumbar corset Restrictions Weight Bearing Restrictions: No    Mobility  Bed Mobility               General bed mobility comments: In recliner upon arrival    Transfers Overall transfer level: Needs assistance Equipment used: None Transfers: Sit to/from Stand Sit to Stand: Min guard         General transfer comment: Min guard A for safety. Increased time required.  Ambulation/Gait Ambulation/Gait assistance: Min guard Gait Distance (Feet): 100 Feet Assistive device: None Gait Pattern/deviations: Step-through pattern;Decreased stride length;Shuffle Gait velocity: Decreased Gait velocity interpretation: <1.31 ft/sec, indicative of household ambulator General Gait Details: Slow and guarded but without overt LOB. Was able to make a minor adjustment to gait speed with cues but was not able to maintain  long before slowing back down.   Stairs             Wheelchair Mobility    Modified Rankin (Stroke Patients Only)       Balance Overall balance assessment: Needs assistance Sitting-balance support: Feet supported;No upper extremity supported Sitting balance-Leahy Scale: Good     Standing balance support: No upper extremity supported;Single extremity supported Standing balance-Leahy Scale: Fair Standing balance comment: Able to maintain static standing without UE support                            Cognition Arousal/Alertness: Awake/alert Behavior During Therapy: WFL for tasks assessed/performed;Flat affect Overall Cognitive Status: No family/caregiver present to determine baseline cognitive functioning                                 General Comments: Continues to present with flat affect and slow processing. Pt reporting "this medicine still makes me feel off". Following cues and agreeable to therapy      Exercises      General Comments        Pertinent Vitals/Pain Pain Assessment: Faces Faces Pain Scale: Hurts little more Pain Location: back Pain Descriptors / Indicators: Aching;Operative site guarding Pain Intervention(s): Limited activity within patient's tolerance;Monitored during session;Repositioned    Home Living                      Prior Function            PT Goals (current goals can now be found in  the care plan section) Acute Rehab PT Goals Patient Stated Goal: to decrease pain PT Goal Formulation: With patient Time For Goal Achievement: 02/14/21 Potential to Achieve Goals: Good Progress towards PT goals: Progressing toward goals    Frequency    Min 5X/week      PT Plan Current plan remains appropriate    Co-evaluation              AM-PAC PT "6 Clicks" Mobility   Outcome Measure  Help needed turning from your back to your side while in a flat bed without using bedrails?: A Little Help  needed moving from lying on your back to sitting on the side of a flat bed without using bedrails?: A Little Help needed moving to and from a bed to a chair (including a wheelchair)?: A Little Help needed standing up from a chair using your arms (e.g., wheelchair or bedside chair)?: A Little Help needed to walk in hospital room?: A Little Help needed climbing 3-5 steps with a railing? : A Lot 6 Click Score: 17    End of Session Equipment Utilized During Treatment: Gait belt;Back brace Activity Tolerance: Patient limited by pain Patient left: in bed;with call bell/phone within reach Nurse Communication: Mobility status PT Visit Diagnosis: Unsteadiness on feet (R26.81);Muscle weakness (generalized) (M62.81);Pain Pain - part of body:  (back)     Time: 1779-3903 PT Time Calculation (min) (ACUTE ONLY): 46 min  Charges:  $Gait Training: 38-52 mins                     Cynthia Schaefer, PT, DPT Acute Rehabilitation Services Pager: [phone] Office: [phone]    Marylynn Pearson 02/03/2021, 12:13 PM

## 2021-02-03 NOTE — Progress Notes (Signed)
    Durable Medical Equipment  (From admission, onward)         Start     Ordered   02/03/21 1322  For home use only DME 3 n 1  Once        02/03/21 1322   02/03/21 1322  For home use only DME Walker  Once       Question:  Patient needs a walker to treat with the following condition  Answer:  S/P lumbar spinal fusion   02/03/21 1322   02/03/21 1322  For home use only DME Tub bench  Once        02/03/21 1322   02/03/21 1312  For home use only DME Hospital bed  Once       Question Answer Comment  Length of Need 6 Months   Patient has (list medical condition): Spinal Stenosis of Lumbar Region with Neurogenic Claudication   The above medical condition requires: Patient requires the ability to reposition frequently   Head must be elevated greater than: 30 degrees   Bed type Semi-electric   Support Surface: Gel Overlay      02/03/21 1320

## 2021-02-03 NOTE — Progress Notes (Signed)
Subjective: Patient reports Difficulty yesterday with increased back pain feels better this morning feels better after blood transfusion.  He will denies any radicular pain  Objective: Vital signs in last 24 hours: Temp:  [97.9 F (36.6 C)-99.1 F (37.3 C)] 98.9 F (37.2 C) (02/18 0340) Pulse Rate:  [87-99] 91 (02/18 0340) Resp:  [16-20] 20 (02/18 0340) BP: (85-123)/(50-69) 116/67 (02/18 0340) SpO2:  [90 %-99 %] 98 % (02/18 0340)  Intake/Output from previous day: 02/17 0701 - 02/18 0700 In: 1165 [I.V.:250; Blood:715; IV Piggyback:200] Out: 1860 [Urine:1250; Drains:610] Intake/Output this shift: No intake/output data recorded.  Patient is awake and alert incisions clean dry and intact  Lab Results: Recent Labs    02/02/21 1054 02/02/21 2141  WBC 13.6* 14.4*  HGB 7.2* 9.0*  HCT 21.6* 27.9*  PLT 150 131*   BMET Recent Labs    02/02/21 1054  NA 140  K 3.8  CL 106  CO2 28  GLUCOSE 112*  BUN 6*  CREATININE 0.76  CALCIUM 8.0*    Studies/Results: DG Lumbar Spine 2-3 Views  Result Date: 02/01/2021 CLINICAL DATA:  L2 through S1 fusion. EXAM: DG C-ARM 1-60 MIN; LUMBAR SPINE - 2-3 VIEW FLUOROSCOPY TIME:  Fluoroscopy Time:  2 minutes and 4 seconds. COMPARISON:  04/29/2021. FINDINGS: Two C-arm fluoroscopic images were obtained intraoperatively and submitted for post operative interpretation. These images demonstrate posterior pedicle screws at L2 through S1 with intervening cages. Prior fusion at L5-S1. Please see the performing provider's procedural report for further detail. IMPRESSION: Intraoperative fluoroscopy, as detailed above. Electronically Signed   By: Feliberto Harts MD   On: 02/01/2021 14:54   DG C-Arm 1-60 Min  Result Date: 02/01/2021 CLINICAL DATA:  L2 through S1 fusion. EXAM: DG C-ARM 1-60 MIN; LUMBAR SPINE - 2-3 VIEW FLUOROSCOPY TIME:  Fluoroscopy Time:  2 minutes and 4 seconds. COMPARISON:  04/29/2021. FINDINGS: Two C-arm fluoroscopic images were obtained  intraoperatively and submitted for post operative interpretation. These images demonstrate posterior pedicle screws at L2 through S1 with intervening cages. Prior fusion at L5-S1. Please see the performing provider's procedural report for further detail. IMPRESSION: Intraoperative fluoroscopy, as detailed above. Electronically Signed   By: Feliberto Harts MD   On: 02/01/2021 14:54    Assessment/Plan: Postop day 2 posterior spinal fixation with expected incisional pain.  Status post transfusion we will recheck CBC posttransfusion hematocrit.  Mobilize today with physical Occupational Therapy possible discharge later today or in the morning based on how patient response to therapy.  LOS: 4 days     Mariam Dollar 02/03/2021, 7:27 AM

## 2021-02-03 NOTE — Progress Notes (Signed)
Occupational Therapy Treatment Patient Details Name: Cynthia Schaefer MRN: [id number] DOB: 1961-05-10 Today's Date: 02/03/2021    History of present illness Pt is a 60 y/o female s/p L5-S1 ALIF, L2-3 L3-4 anterior lateral interbody fusions. Pt for second part of surgery on 2/16. PMH includes HTN and scoliosis.   OT comments  Pt progressing towards established OT goals. Pt continues to present with decreased processing, balance, and strength. Providing education on safe tub transfer techniques. Pt performing simulated tub transfer with Min A and cues for sequencing. Min Guard A and RW required for functional mobility. Continue to recommend dc to home with HHOT and will continue to follow acutely as admitted.     Follow Up Recommendations  Supervision/Assistance - 24 hour;Home health OT    Equipment Recommendations  3 in 1 bedside commode    Recommendations for Other Services PT consult    Precautions / Restrictions Precautions Precautions: Back Precaution Booklet Issued: Yes (comment) Precaution Comments: Reviewed back precautions Required Braces or Orthoses: Spinal Brace Spinal Brace: Lumbar corset       Mobility Bed Mobility               General bed mobility comments: In recliner upon arrival  Transfers Overall transfer level: Needs assistance Equipment used: None Transfers: Sit to/from Stand Sit to Stand: Min guard         General transfer comment: Min guard A for safety. Increased time required.    Balance Overall balance assessment: Needs assistance Sitting-balance support: Feet supported;No upper extremity supported Sitting balance-Leahy Scale: Good     Standing balance support: No upper extremity supported;Single extremity supported Standing balance-Leahy Scale: Fair Standing balance comment: Able to maintain static standing without UE support                           ADL either performed or assessed with clinical judgement   ADL  Overall ADL's : Needs assistance/impaired                         Toilet Transfer: Min guard;Ambulation (simulated to recliner)       Tub/ Shower Transfer: Tub transfer;Minimal assistance;Ambulation;Rolling walker Tub/Shower Transfer Details (indicate cue type and reason): Educating pt on safe tub transfer techniques. Pt performing with Min A for balance and sequencing. Functional mobility during ADLs: Min guard;Rolling walker General ADL Comments: Pt presenting with decreased arousal, cognition, and strength. Focused session on tub trnasfer techniques.     Vision       Perception     Praxis      Cognition Arousal/Alertness: Awake/alert Behavior During Therapy: WFL for tasks assessed/performed;Flat affect Overall Cognitive Status: No family/caregiver present to determine baseline cognitive functioning                                 General Comments: Continues to present with slow processing. Pt reporting "this medicine still makes me feel off". Following cues and agreeable to therapy        Exercises     Shoulder Instructions       General Comments      Pertinent Vitals/ Pain       Pain Assessment: Faces Faces Pain Scale: Hurts even more Pain Location: back Pain Descriptors / Indicators: Aching;Operative site guarding Pain Intervention(s): Monitored during session;Limited activity within patient's tolerance;Repositioned  Home Living  Prior Functioning/Environment              Frequency  Min 2X/week        Progress Toward Goals  OT Goals(current goals can now be found in the care plan section)  Progress towards OT goals: Progressing toward goals  Acute Rehab OT Goals Patient Stated Goal: to decrease pain OT Goal Formulation: With patient Time For Goal Achievement: 02/16/21 Potential to Achieve Goals: Good ADL Goals Pt Will Perform Lower Body Dressing: with  modified independence;sit to/from stand;with adaptive equipment;with caregiver independent in assisting Pt Will Transfer to Toilet: with modified independence;ambulating;regular height toilet Pt Will Perform Tub/Shower Transfer: Tub transfer;with supervision;3 in 1;rolling walker;ambulating Additional ADL Goal #1: Pt will perform bed mobility using log roll technique with Supervision in preparation for ADLs Additional ADL Goal #2: Pt will don/doff brace with Supervision  Plan Discharge plan remains appropriate    Co-evaluation                 AM-PAC OT "6 Clicks" Daily Activity     Outcome Measure   Help from another person eating meals?: None Help from another person taking care of personal grooming?: A Little Help from another person toileting, which includes using toliet, bedpan, or urinal?: A Little Help from another person bathing (including washing, rinsing, drying)?: A Little Help from another person to put on and taking off regular upper body clothing?: A Little Help from another person to put on and taking off regular lower body clothing?: A Little 6 Click Score: 19    End of Session Equipment Utilized During Treatment: Rolling walker;Back brace  OT Visit Diagnosis: Unsteadiness on feet (R26.81);Other abnormalities of gait and mobility (R26.89);Muscle weakness (generalized) (M62.81);Pain   Activity Tolerance Patient tolerated treatment well   Patient Left in chair;with call bell/phone within reach   Nurse Communication Mobility status        Time: 1308-6578 OT Time Calculation (min): 20 min  Charges: OT General Charges $OT Visit: 1 Visit OT Treatments $Self Care/Home Management : 8-22 mins  Jovanie Verge MSOT, OTR/L Acute Rehab Pager: [phone] Office: [phone]   Theodoro Grist Aella Ronda 02/03/2021, 10:46 AM

## 2021-02-03 NOTE — Progress Notes (Signed)
Patient's sister called staff to inform patient about patient getting confused Stated " My sister told me she saw people walking in her room and she saw some people doing construction outside the room"  Rn noted to be the s/s of oxycodone and new orders received for different pain medication. RN will continue to monitor patient's status closely

## 2021-02-03 NOTE — Discharge Instructions (Signed)
Wound Care  Keep the incision clean and dry remove the outer dressing in 2 days, Do not put any creams, lotions, or ointments on incision. Leave steri-strips on back.  They will fall off by themselves.  Activity Walk each and every day, increasing distance each day. No lifting greater than 5 lbs.  No lifting no bending no twisting no driving or riding a car unless coming back and forth to see me. If provided with back brace, wear when out of bed.  It is not necessary to wear brace in bed. Diet Resume your normal diet.   Return to Work Will be discussed at you follow up appointment.  Call Your Doctor If Any of These Occur Redness, drainage, or swelling at the wound.  Temperature greater than 101 degrees. Severe pain not relieved by pain medication. Incision starts to come apart. Follow Up Appt Call today for appointment in 1-2 weeks ([phone]) or for problems.  If you have any hardware placed in your spine, you will need an x-ray before your appointment.   

## 2021-02-04 MED ORDER — HYDROCODONE-ACETAMINOPHEN 10-325 MG PO TABS: 1.0000 | ORAL_TABLET | ORAL | 0 refills | Status: AC | PRN

## 2021-02-04 MED ORDER — HYDROCODONE-ACETAMINOPHEN 10-325 MG PO TABS: 1.0000 | ORAL_TABLET | ORAL | 0 refills | Status: DC | PRN

## 2021-02-04 NOTE — Discharge Summary (Signed)
Physician Discharge Summary     Providing Compassionate, Quality Care - Together   Patient ID: Cynthia Schaefer MRN: [id number] DOB/AGE: August 02, 1961 60 y.o.  Admit date: 01/30/2021 Discharge date: 02/04/2021  Admission Diagnoses: Spinal stenosis of lumbar region, spinal stenosis at L4-L5 level  Discharge Diagnoses:  Active Problems:   Spinal stenosis of lumbar region   Spinal stenosis at L4-L5 level   Discharged Condition: good  Hospital Course: Patient underwent a two part decompression and fusion by Dr. Wynetta Emery on 01/30/2021 and 02/01/2021. She was admitted to 3C07 following recovery from anesthesia in the PACU. Her postoperative course was complicated by blood loss anemia and confusion. She has worked with both physical and occupational therapies who who feel the patient is ready for discharge home. She is ambulating with the aid of a walker. She is tolerating a normal diet. She is not having any bowel or bladder dysfunction. Her pain is well-controlled with oral pain medication. She is ready for discharge home.   Consults: None  Significant Diagnostic Studies: radiology: DG Lumbar Spine 2-3 Views  Result Date: 02/01/2021 CLINICAL DATA:  L2 through S1 fusion. EXAM: DG C-ARM 1-60 MIN; LUMBAR SPINE - 2-3 VIEW FLUOROSCOPY TIME:  Fluoroscopy Time:  2 minutes and 4 seconds. COMPARISON:  04/29/2021. FINDINGS: Two C-arm fluoroscopic images were obtained intraoperatively and submitted for post operative interpretation. These images demonstrate posterior pedicle screws at L2 through S1 with intervening cages. Prior fusion at L5-S1. Please see the performing provider's procedural report for further detail. IMPRESSION: Intraoperative fluoroscopy, as detailed above. Electronically Signed   By: Feliberto Harts MD   On: 02/01/2021 14:54   DG C-Arm 1-60 Min  Result Date: 02/01/2021 CLINICAL DATA:  L2 through S1 fusion. EXAM: DG C-ARM 1-60 MIN; LUMBAR SPINE - 2-3 VIEW FLUOROSCOPY TIME:  Fluoroscopy  Time:  2 minutes and 4 seconds. COMPARISON:  04/29/2021. FINDINGS: Two C-arm fluoroscopic images were obtained intraoperatively and submitted for post operative interpretation. These images demonstrate posterior pedicle screws at L2 through S1 with intervening cages. Prior fusion at L5-S1. Please see the performing provider's procedural report for further detail. IMPRESSION: Intraoperative fluoroscopy, as detailed above. Electronically Signed   By: Feliberto Harts MD   On: 02/01/2021 14:54     Treatments: surgery:  01/30/2021: Anterior lateral interbody fusions at L2-3 and L3-4 utilizing the Alphatec titanium cages packed with osteocell pro with intraoperative neural monitoring  02/01/2021: 1. decompressive laminectomy right-sided L3-4 with foraminotomies of the L3 nerve root and partial medial facetectomy  2.  Complete decompressive laminectomy L4-5 with complete medial facetectomies radical foraminotomies of the L4 and L5 nerve root in excess and requiring more work than would be needed with a standard interbody fusion  3.  Transforaminal interbody fusion L4-5 utilizing the globus Altera cage packed with locally harvested autograft mixed with Protios, vivgen and locally harvested autograft placement from the left side  4.  Pedicle screw fixation L2-S1 utilizing the globus Creo new quarter inch pedicle screw set with bilateral pedicle screws at L2, L3, L4, L5, and S1  5.  Posterior lateral fusion utilizing locally harvested autograft mixed at L2-3, L3-4, L4-5, and L5-S1 with aggressive decortication of the TPs and lateral facet joints  6.Open reduction spinal deformity L2-S1  Discharge Exam: Blood pressure 116/69, pulse 89, temperature 98.7 F (37.1 C), temperature source Oral, resp. rate 18, height 5\' 6"  (1.676 m), weight 94.3 kg, SpO2 97 %.   Alert and oriented x 4 PERRLA CN II-XII grossly intact MAE, Strength and  sensation intact Incision is covered with Honeycomb dressing and  Steri Strips; Dressing is clean, dry, and intact   Disposition: Discharge disposition: 01-Home or Self Care        Allergies as of 02/04/2021      Reactions   Metronidazole    REACTION: hives      Medication List    STOP taking these medications   ibuprofen 200 MG tablet Commonly known as: ADVIL   meloxicam 15 MG tablet Commonly known as: MOBIC     TAKE these medications   acetaminophen 650 MG CR tablet Commonly known as: TYLENOL Take 650 mg by mouth every 8 (eight) hours as needed for pain.   amLODipine 5 MG tablet Commonly known as: NORVASC Take 5 mg by mouth daily.   ESTROVEN PO Take 1 tablet by mouth daily.   HYDROcodone-acetaminophen 10-325 MG tablet Commonly known as: NORCO Take 1 tablet by mouth every 4 (four) hours as needed for moderate pain.   levothyroxine 100 MCG tablet Commonly known as: SYNTHROID TAKE 1 TABLET BY MOUTH EVERY DAY What changed: when to take this   methocarbamol 500 MG tablet Commonly known as: Robaxin Take 1 tablet (500 mg total) by mouth 4 (four) times daily.   methylphenidate 10 MG 24 hr capsule Commonly known as: RITALIN LA Take 10 mg by mouth daily as needed (alertness).   rosuvastatin 5 MG tablet Commonly known as: CRESTOR TAKE 1 TABLET BY MOUTH AT NIGHT What changed: when to take this   Venlafaxine HCl 225 MG Tb24 Take 225 mg by mouth daily.            Durable Medical Equipment  (From admission, onward)         Start     Ordered   02/03/21 1322  For home use only DME 3 n 1  Once        02/03/21 1322   02/03/21 1322  For home use only DME Walker  Once       Question:  Patient needs a walker to treat with the following condition  Answer:  S/P lumbar spinal fusion   02/03/21 1322   02/03/21 1322  For home use only DME Tub bench  Once        02/03/21 1322   02/03/21 1312  For home use only DME Hospital bed  Once       Question Answer Comment  Length of Need 6 Months   Patient has (list medical  condition): Spinal Stenosis of Lumbar Region with Neurogenic Claudication   The above medical condition requires: Patient requires the ability to reposition frequently   Head must be elevated greater than: 30 degrees   Bed type Semi-electric   Support Surface: Gel Overlay      02/03/21 1320          Follow-up Information    Donalee Citrin, MD. Go on 02/14/2021.   Specialty: Neurosurgery Why: Appointment is at 3:30 pm Contact information: [address] [place] [address] [phone]               Signed: Floreen Comber 02/04/2021, 10:06 AM

## 2021-02-04 NOTE — Progress Notes (Signed)
Physical Therapy Treatment Patient Details Name: Cynthia Schaefer MRN: [id number] DOB: 03-14-1961 Today's Date: 02/04/2021    History of Present Illness Pt is a 60 y/o female s/p L5-S1 ALIF, L2-3 L3-4 anterior lateral interbody fusions. Pt for second part of surgery on 2/16. PMH includes HTN and scoliosis.    PT Comments    Session limited today at sister's request. Today's session focused on fitting RW for pt's expected d/c home today. One transfer performed to test fit. Sister requested ambulation be deferred today. Pt left in recliner chair with needs met and sister present.     Follow Up Recommendations  No PT follow up;Supervision for mobility/OOB     Equipment Recommendations  Rolling walker with 5" wheels;3in1 (PT)    Recommendations for Other Services       Precautions / Restrictions Precautions Precautions: Back Precaution Booklet Issued: Yes (comment) Precaution Comments: Reviewed back precautions Required Braces or Orthoses: Spinal Brace Spinal Brace: Lumbar corset    Mobility  Bed Mobility                    Transfers Overall transfer level: Needs assistance Equipment used: Rolling walker (2 wheeled) Transfers: Sit to/from Stand Sit to Stand: Min guard         General transfer comment: min guard for safety. Cues for hand placement with RW.  Ambulation/Gait Ambulation/Gait assistance: Min guard   Assistive device: None Gait Pattern/deviations: Step-through pattern;Decreased stride length;Shuffle Gait velocity: Decreased   General Gait Details: Slow and guarded but without overt LOB. Was able to make a minor adjustment to gait speed with cues but was not able to maintain long before slowing back down.   Stairs             Wheelchair Mobility    Modified Rankin (Stroke Patients Only)       Balance Overall balance assessment: Needs assistance Sitting-balance support: Feet supported;No upper extremity supported Sitting  balance-Leahy Scale: Good     Standing balance support: No upper extremity supported;Single extremity supported Standing balance-Leahy Scale: Fair Standing balance comment: Able to maintain static standing without UE support                            Cognition Arousal/Alertness: Awake/alert Behavior During Therapy: WFL for tasks assessed/performed;Flat affect Overall Cognitive Status: History of cognitive impairments - at baseline                                        Exercises      General Comments        Pertinent Vitals/Pain      Home Living                      Prior Function            PT Goals (current goals can now be found in the care plan section) Acute Rehab PT Goals Patient Stated Goal: to decrease pain PT Goal Formulation: With patient Time For Goal Achievement: 02/14/21 Potential to Achieve Goals: Good Progress towards PT goals: Progressing toward goals    Frequency    Min 5X/week      PT Plan Current plan remains appropriate    Co-evaluation              AM-PAC PT "6 Clicks" Mobility  Outcome Measure  Help needed turning from your back to your side while in a flat bed without using bedrails?: A Little Help needed moving from lying on your back to sitting on the side of a flat bed without using bedrails?: A Little Help needed moving to and from a bed to a chair (including a wheelchair)?: A Little Help needed standing up from a chair using your arms (e.g., wheelchair or bedside chair)?: A Little Help needed to walk in hospital room?: A Little Help needed climbing 3-5 steps with a railing? : A Lot 6 Click Score: 17    End of Session Equipment Utilized During Treatment: Back brace Activity Tolerance: Other (comment) (Limited by family) Patient left: with call bell/phone within reach;in chair;with family/visitor present Nurse Communication: Mobility status PT Visit Diagnosis: Unsteadiness on feet  (R26.81);Muscle weakness (generalized) (M62.81);Pain Pain - part of body:  (back)     Time: 4496-7591 PT Time Calculation (min) (ACUTE ONLY): 8 min  Charges:  $Therapeutic Activity: 8-22 mins                     Benjiman Core, [place] Pager [phone] Acute Rehab   Allena Katz 02/04/2021, 9:55 AM

## 2021-02-04 NOTE — Care Management (Signed)
Nusing staff has ordered hospital bed and will provide any other DME needed for DC from unit supply.

## 2021-02-04 NOTE — Plan of Care (Signed)
Patient is discharged from room 3C07 at this time. Alert and in stable condition. IV site d/c'd and instructions read to patient and sister with understanding verbalized and all questions answered. Left unit via wheelchair with all belongings at side. Hospital bed and DME given to patient at discharge. Extra dressing given to patient and sister for home use.

## 2021-02-04 NOTE — Progress Notes (Signed)
   Providing Compassionate, Quality Care - Together   Patient's family member picked up the oxycodone that was sent yesterday by mistake. This medication was discontinued as it was causing the patient to hallucinate. Contacted the patient's pharmacy regarding the change in medication.  New prescription for hydrocodone/acetaminophen was sent to the patient's pharmacy.  The patient and the patient's sister were advised to bring the oxycodone back to the pharmacy for proper disposal.  They voiced understanding.    Val Eagle, DNP, AGNP-C Nurse Practitioner  [facility] [address], [address], [place], [place] [address] P: [phone]    F: [phone]

## 2021-02-06 MED FILL — Heparin Sodium (Porcine) Inj 1000 Unit/ML: INTRAMUSCULAR | Qty: 30 | Status: AC

## 2021-02-06 MED FILL — Sodium Chloride IV Soln 0.9%: INTRAVENOUS | Qty: 1000 | Status: AC

## 2021-02-09 DIAGNOSIS — N390 Urinary tract infection, site not specified: Secondary | ICD-10-CM | POA: Diagnosis not present

## 2021-02-09 DIAGNOSIS — I10 Essential (primary) hypertension: Secondary | ICD-10-CM | POA: Diagnosis not present

## 2021-02-09 DIAGNOSIS — E785 Hyperlipidemia, unspecified: Secondary | ICD-10-CM | POA: Diagnosis not present

## 2021-02-09 DIAGNOSIS — F5101 Primary insomnia: Secondary | ICD-10-CM | POA: Diagnosis not present

## 2021-02-16 DIAGNOSIS — I10 Essential (primary) hypertension: Secondary | ICD-10-CM | POA: Diagnosis not present

## 2021-02-16 DIAGNOSIS — E785 Hyperlipidemia, unspecified: Secondary | ICD-10-CM | POA: Diagnosis not present

## 2021-02-16 DIAGNOSIS — N390 Urinary tract infection, site not specified: Secondary | ICD-10-CM | POA: Diagnosis not present

## 2021-02-16 DIAGNOSIS — F5101 Primary insomnia: Secondary | ICD-10-CM | POA: Diagnosis not present

## 2021-03-09 DIAGNOSIS — E039 Hypothyroidism, unspecified: Secondary | ICD-10-CM | POA: Diagnosis not present

## 2021-03-09 DIAGNOSIS — R300 Dysuria: Secondary | ICD-10-CM | POA: Diagnosis not present

## 2021-03-09 DIAGNOSIS — E785 Hyperlipidemia, unspecified: Secondary | ICD-10-CM | POA: Diagnosis not present

## 2021-03-09 DIAGNOSIS — I10 Essential (primary) hypertension: Secondary | ICD-10-CM | POA: Diagnosis not present

## 2021-03-09 DIAGNOSIS — D649 Anemia, unspecified: Secondary | ICD-10-CM | POA: Diagnosis not present

## 2021-03-09 DIAGNOSIS — E559 Vitamin D deficiency, unspecified: Secondary | ICD-10-CM | POA: Diagnosis not present

## 2021-03-09 DIAGNOSIS — M8580 Other specified disorders of bone density and structure, unspecified site: Secondary | ICD-10-CM | POA: Diagnosis not present

## 2021-03-14 DIAGNOSIS — M48062 Spinal stenosis, lumbar region with neurogenic claudication: Secondary | ICD-10-CM | POA: Diagnosis not present

## 2021-03-23 DIAGNOSIS — M1711 Unilateral primary osteoarthritis, right knee: Secondary | ICD-10-CM | POA: Diagnosis not present

## 2021-03-23 DIAGNOSIS — M1712 Unilateral primary osteoarthritis, left knee: Secondary | ICD-10-CM | POA: Diagnosis not present

## 2021-03-28 ENCOUNTER — Other Ambulatory Visit: Payer: Self-pay | Admitting: Family Medicine

## 2021-04-06 DIAGNOSIS — E785 Hyperlipidemia, unspecified: Secondary | ICD-10-CM | POA: Diagnosis not present

## 2021-04-06 DIAGNOSIS — M159 Polyosteoarthritis, unspecified: Secondary | ICD-10-CM | POA: Diagnosis not present

## 2021-04-06 DIAGNOSIS — F5101 Primary insomnia: Secondary | ICD-10-CM | POA: Diagnosis not present

## 2021-04-06 DIAGNOSIS — M1712 Unilateral primary osteoarthritis, left knee: Secondary | ICD-10-CM | POA: Diagnosis not present

## 2021-04-06 DIAGNOSIS — M8580 Other specified disorders of bone density and structure, unspecified site: Secondary | ICD-10-CM | POA: Diagnosis not present

## 2021-04-13 DIAGNOSIS — M1712 Unilateral primary osteoarthritis, left knee: Secondary | ICD-10-CM | POA: Diagnosis not present

## 2021-04-20 DIAGNOSIS — M1712 Unilateral primary osteoarthritis, left knee: Secondary | ICD-10-CM | POA: Diagnosis not present

## 2021-04-25 DIAGNOSIS — M5440 Lumbago with sciatica, unspecified side: Secondary | ICD-10-CM | POA: Diagnosis not present

## 2021-06-01 DIAGNOSIS — E559 Vitamin D deficiency, unspecified: Secondary | ICD-10-CM | POA: Diagnosis not present

## 2021-06-01 DIAGNOSIS — I10 Essential (primary) hypertension: Secondary | ICD-10-CM | POA: Diagnosis not present

## 2021-06-01 DIAGNOSIS — E785 Hyperlipidemia, unspecified: Secondary | ICD-10-CM | POA: Diagnosis not present

## 2021-06-01 DIAGNOSIS — F5101 Primary insomnia: Secondary | ICD-10-CM | POA: Diagnosis not present

## 2021-06-01 DIAGNOSIS — M8580 Other specified disorders of bone density and structure, unspecified site: Secondary | ICD-10-CM | POA: Diagnosis not present

## 2021-07-25 DIAGNOSIS — I10 Essential (primary) hypertension: Secondary | ICD-10-CM | POA: Diagnosis not present

## 2021-07-25 DIAGNOSIS — M5440 Lumbago with sciatica, unspecified side: Secondary | ICD-10-CM | POA: Diagnosis not present

## 2021-07-25 DIAGNOSIS — Z6833 Body mass index (BMI) 33.0-33.9, adult: Secondary | ICD-10-CM | POA: Diagnosis not present

## 2021-08-02 DIAGNOSIS — M545 Low back pain, unspecified: Secondary | ICD-10-CM | POA: Diagnosis not present

## 2021-08-09 DIAGNOSIS — M545 Low back pain, unspecified: Secondary | ICD-10-CM | POA: Diagnosis not present

## 2021-08-11 DIAGNOSIS — M545 Low back pain, unspecified: Secondary | ICD-10-CM | POA: Diagnosis not present

## 2021-08-16 DIAGNOSIS — M545 Low back pain, unspecified: Secondary | ICD-10-CM | POA: Diagnosis not present

## 2021-08-18 DIAGNOSIS — M545 Low back pain, unspecified: Secondary | ICD-10-CM | POA: Diagnosis not present

## 2021-08-24 DIAGNOSIS — F988 Other specified behavioral and emotional disorders with onset usually occurring in childhood and adolescence: Secondary | ICD-10-CM | POA: Diagnosis not present

## 2021-08-24 DIAGNOSIS — E559 Vitamin D deficiency, unspecified: Secondary | ICD-10-CM | POA: Diagnosis not present

## 2021-08-24 DIAGNOSIS — D649 Anemia, unspecified: Secondary | ICD-10-CM | POA: Diagnosis not present

## 2021-08-24 DIAGNOSIS — E039 Hypothyroidism, unspecified: Secondary | ICD-10-CM | POA: Diagnosis not present

## 2021-08-24 DIAGNOSIS — I10 Essential (primary) hypertension: Secondary | ICD-10-CM | POA: Diagnosis not present

## 2021-08-24 DIAGNOSIS — E785 Hyperlipidemia, unspecified: Secondary | ICD-10-CM | POA: Diagnosis not present

## 2021-08-24 DIAGNOSIS — F5101 Primary insomnia: Secondary | ICD-10-CM | POA: Diagnosis not present

## 2021-08-24 DIAGNOSIS — M8580 Other specified disorders of bone density and structure, unspecified site: Secondary | ICD-10-CM | POA: Diagnosis not present

## 2021-08-25 DIAGNOSIS — M545 Low back pain, unspecified: Secondary | ICD-10-CM | POA: Diagnosis not present

## 2021-08-30 DIAGNOSIS — M545 Low back pain, unspecified: Secondary | ICD-10-CM | POA: Diagnosis not present

## 2021-09-01 DIAGNOSIS — M545 Low back pain, unspecified: Secondary | ICD-10-CM | POA: Diagnosis not present

## 2021-09-14 DIAGNOSIS — M545 Low back pain, unspecified: Secondary | ICD-10-CM | POA: Diagnosis not present

## 2021-09-20 DIAGNOSIS — M545 Low back pain, unspecified: Secondary | ICD-10-CM | POA: Diagnosis not present

## 2021-09-21 DIAGNOSIS — M8580 Other specified disorders of bone density and structure, unspecified site: Secondary | ICD-10-CM | POA: Diagnosis not present

## 2021-09-21 DIAGNOSIS — E559 Vitamin D deficiency, unspecified: Secondary | ICD-10-CM | POA: Diagnosis not present

## 2021-09-21 DIAGNOSIS — F5101 Primary insomnia: Secondary | ICD-10-CM | POA: Diagnosis not present

## 2021-09-21 DIAGNOSIS — F988 Other specified behavioral and emotional disorders with onset usually occurring in childhood and adolescence: Secondary | ICD-10-CM | POA: Diagnosis not present

## 2021-09-22 DIAGNOSIS — M545 Low back pain, unspecified: Secondary | ICD-10-CM | POA: Diagnosis not present

## 2021-09-26 DIAGNOSIS — M545 Low back pain, unspecified: Secondary | ICD-10-CM | POA: Diagnosis not present

## 2021-09-28 DIAGNOSIS — M545 Low back pain, unspecified: Secondary | ICD-10-CM | POA: Diagnosis not present

## 2021-10-04 DIAGNOSIS — M545 Low back pain, unspecified: Secondary | ICD-10-CM | POA: Diagnosis not present

## 2021-10-06 DIAGNOSIS — M545 Low back pain, unspecified: Secondary | ICD-10-CM | POA: Diagnosis not present

## 2021-10-13 DIAGNOSIS — L739 Follicular disorder, unspecified: Secondary | ICD-10-CM | POA: Diagnosis not present

## 2021-10-13 DIAGNOSIS — D485 Neoplasm of uncertain behavior of skin: Secondary | ICD-10-CM | POA: Diagnosis not present

## 2021-10-13 DIAGNOSIS — L7211 Pilar cyst: Secondary | ICD-10-CM | POA: Diagnosis not present

## 2021-10-13 DIAGNOSIS — L918 Other hypertrophic disorders of the skin: Secondary | ICD-10-CM | POA: Diagnosis not present

## 2021-10-17 DIAGNOSIS — M545 Low back pain, unspecified: Secondary | ICD-10-CM | POA: Diagnosis not present

## 2021-10-19 DIAGNOSIS — E559 Vitamin D deficiency, unspecified: Secondary | ICD-10-CM | POA: Diagnosis not present

## 2021-10-19 DIAGNOSIS — M8580 Other specified disorders of bone density and structure, unspecified site: Secondary | ICD-10-CM | POA: Diagnosis not present

## 2021-10-19 DIAGNOSIS — F5101 Primary insomnia: Secondary | ICD-10-CM | POA: Diagnosis not present

## 2021-10-19 DIAGNOSIS — F988 Other specified behavioral and emotional disorders with onset usually occurring in childhood and adolescence: Secondary | ICD-10-CM | POA: Diagnosis not present

## 2021-10-23 IMAGING — RF DG LUMBAR SPINE 2-3V
1 series · 2 of 2 positions shown · non-contrast
Comparison: 04/29/2021.

CLINICAL DATA: L2 through S1 fusion.

EXAM:
DG C-ARM 1-60 MIN; LUMBAR SPINE - 2-3 VIEW
FLUOROSCOPY TIME:  Fluoroscopy Time:  2 minutes and 4 seconds.

[Series 1: run · 2 of 2 slices shown]
[im 1/2]
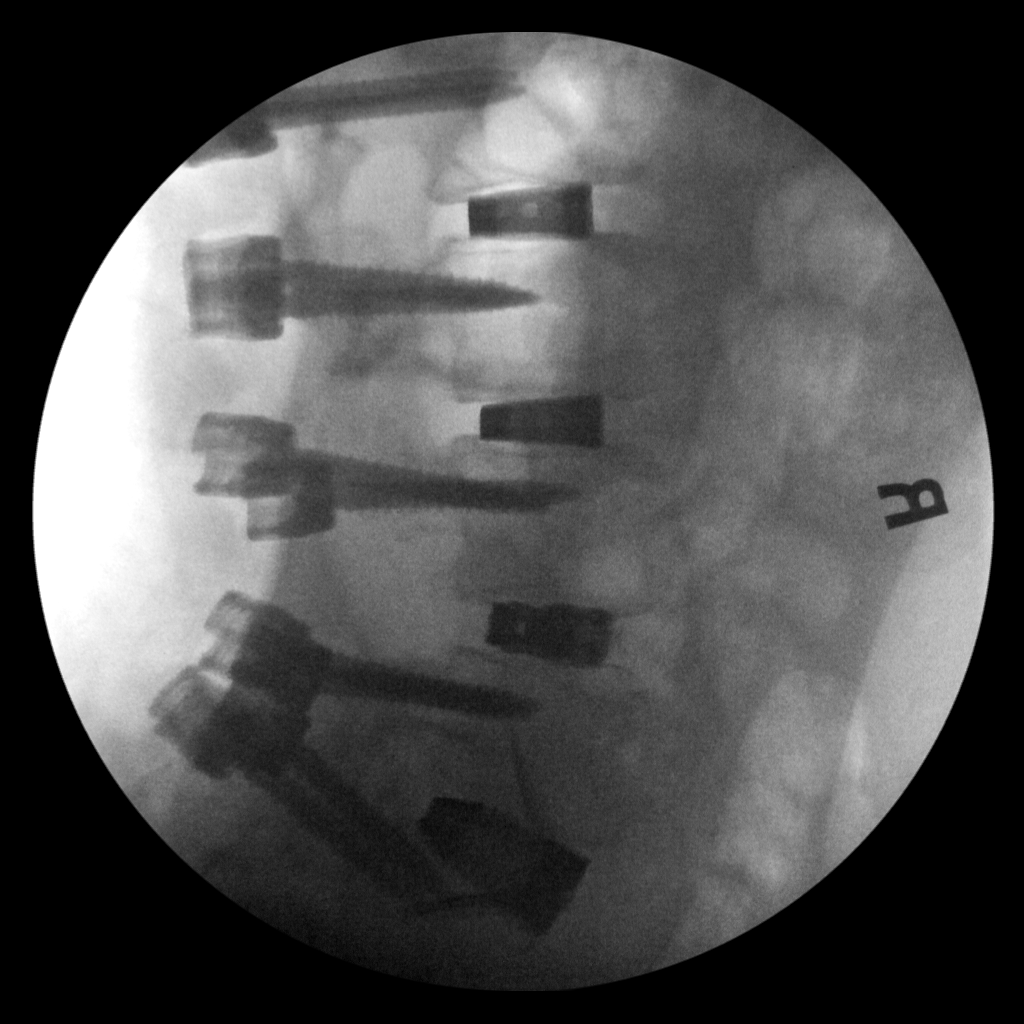
[im 2/2]
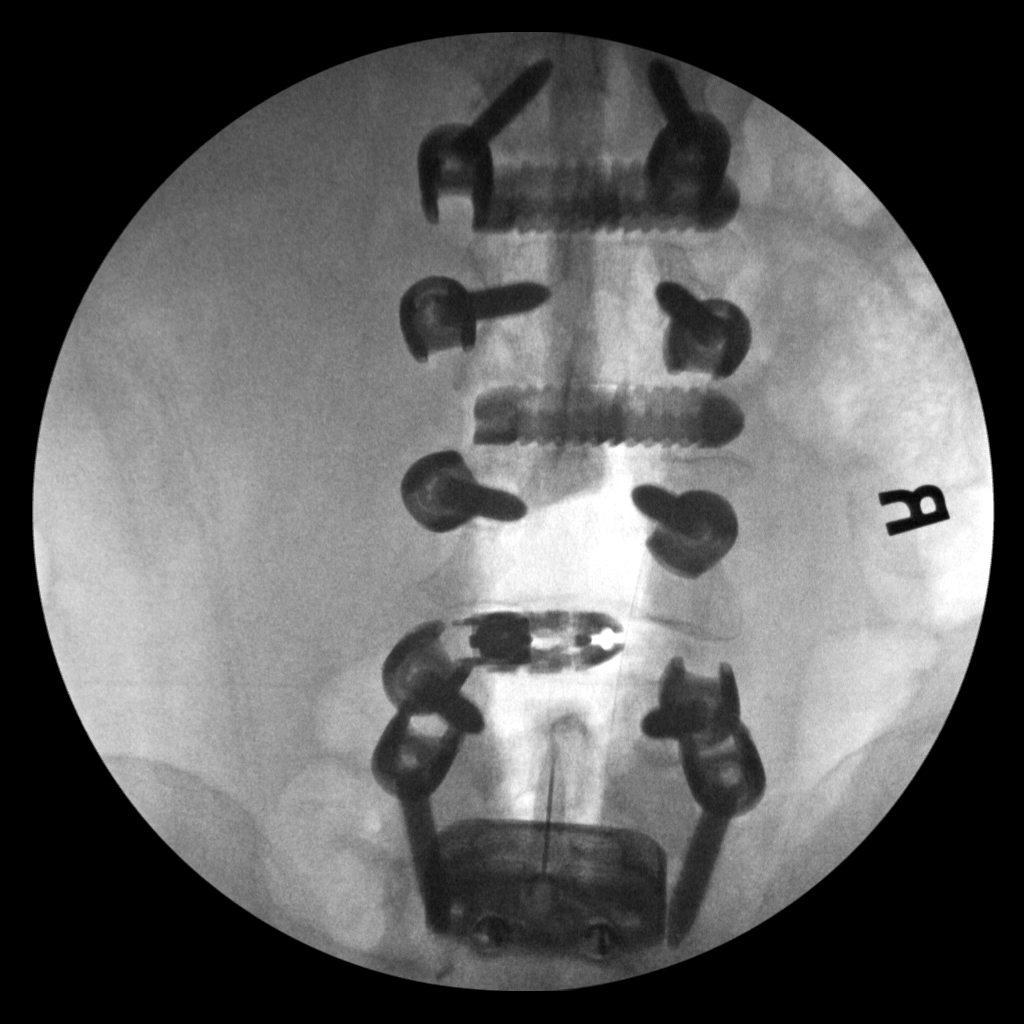

[2 of 2 positions shown; findings below may reference images not displayed]

FINDINGS: Two C-arm fluoroscopic images were obtained intraoperatively and
submitted for post operative interpretation. These images
demonstrate posterior pedicle screws at L2 through S1 with
intervening cages. Prior fusion at L5-S1. Please see the performing
provider's procedural report for further detail.
IMPRESSION: Intraoperative fluoroscopy, as detailed above.

## 2021-10-24 DIAGNOSIS — M5440 Lumbago with sciatica, unspecified side: Secondary | ICD-10-CM | POA: Diagnosis not present

## 2021-11-30 DIAGNOSIS — I10 Essential (primary) hypertension: Secondary | ICD-10-CM | POA: Diagnosis not present

## 2021-11-30 DIAGNOSIS — E039 Hypothyroidism, unspecified: Secondary | ICD-10-CM | POA: Diagnosis not present

## 2021-11-30 DIAGNOSIS — D649 Anemia, unspecified: Secondary | ICD-10-CM | POA: Diagnosis not present

## 2021-11-30 DIAGNOSIS — F988 Other specified behavioral and emotional disorders with onset usually occurring in childhood and adolescence: Secondary | ICD-10-CM | POA: Diagnosis not present

## 2021-11-30 DIAGNOSIS — E785 Hyperlipidemia, unspecified: Secondary | ICD-10-CM | POA: Diagnosis not present

## 2021-11-30 DIAGNOSIS — E559 Vitamin D deficiency, unspecified: Secondary | ICD-10-CM | POA: Diagnosis not present

## 2021-11-30 DIAGNOSIS — F5101 Primary insomnia: Secondary | ICD-10-CM | POA: Diagnosis not present

## 2021-11-30 DIAGNOSIS — M8580 Other specified disorders of bone density and structure, unspecified site: Secondary | ICD-10-CM | POA: Diagnosis not present

## 2021-12-26 DIAGNOSIS — M47816 Spondylosis without myelopathy or radiculopathy, lumbar region: Secondary | ICD-10-CM | POA: Diagnosis not present

## 2021-12-26 DIAGNOSIS — M48062 Spinal stenosis, lumbar region with neurogenic claudication: Secondary | ICD-10-CM | POA: Diagnosis not present

## 2021-12-28 DIAGNOSIS — M8580 Other specified disorders of bone density and structure, unspecified site: Secondary | ICD-10-CM | POA: Diagnosis not present

## 2021-12-28 DIAGNOSIS — F5101 Primary insomnia: Secondary | ICD-10-CM | POA: Diagnosis not present

## 2021-12-28 DIAGNOSIS — E559 Vitamin D deficiency, unspecified: Secondary | ICD-10-CM | POA: Diagnosis not present

## 2021-12-28 DIAGNOSIS — E039 Hypothyroidism, unspecified: Secondary | ICD-10-CM | POA: Diagnosis not present

## 2021-12-28 DIAGNOSIS — M159 Polyosteoarthritis, unspecified: Secondary | ICD-10-CM | POA: Diagnosis not present

## 2021-12-28 DIAGNOSIS — E785 Hyperlipidemia, unspecified: Secondary | ICD-10-CM | POA: Diagnosis not present

## 2021-12-28 DIAGNOSIS — Z6833 Body mass index (BMI) 33.0-33.9, adult: Secondary | ICD-10-CM | POA: Diagnosis not present

## 2021-12-28 DIAGNOSIS — D649 Anemia, unspecified: Secondary | ICD-10-CM | POA: Diagnosis not present

## 2021-12-28 DIAGNOSIS — R69 Illness, unspecified: Secondary | ICD-10-CM | POA: Diagnosis not present

## 2021-12-28 DIAGNOSIS — I10 Essential (primary) hypertension: Secondary | ICD-10-CM | POA: Diagnosis not present

## 2021-12-28 DIAGNOSIS — F3341 Major depressive disorder, recurrent, in partial remission: Secondary | ICD-10-CM | POA: Diagnosis not present

## 2022-01-11 DIAGNOSIS — E669 Obesity, unspecified: Secondary | ICD-10-CM | POA: Diagnosis not present

## 2022-01-11 DIAGNOSIS — Z6834 Body mass index (BMI) 34.0-34.9, adult: Secondary | ICD-10-CM | POA: Diagnosis not present

## 2022-01-11 DIAGNOSIS — D649 Anemia, unspecified: Secondary | ICD-10-CM | POA: Diagnosis not present

## 2022-01-11 DIAGNOSIS — R69 Illness, unspecified: Secondary | ICD-10-CM | POA: Diagnosis not present

## 2022-01-11 DIAGNOSIS — M8580 Other specified disorders of bone density and structure, unspecified site: Secondary | ICD-10-CM | POA: Diagnosis not present

## 2022-01-11 DIAGNOSIS — E559 Vitamin D deficiency, unspecified: Secondary | ICD-10-CM | POA: Diagnosis not present

## 2022-01-11 DIAGNOSIS — I10 Essential (primary) hypertension: Secondary | ICD-10-CM | POA: Diagnosis not present

## 2022-01-11 DIAGNOSIS — M159 Polyosteoarthritis, unspecified: Secondary | ICD-10-CM | POA: Diagnosis not present

## 2022-01-11 DIAGNOSIS — E039 Hypothyroidism, unspecified: Secondary | ICD-10-CM | POA: Diagnosis not present

## 2022-01-11 DIAGNOSIS — E785 Hyperlipidemia, unspecified: Secondary | ICD-10-CM | POA: Diagnosis not present

## 2022-01-25 DIAGNOSIS — J0100 Acute maxillary sinusitis, unspecified: Secondary | ICD-10-CM | POA: Diagnosis not present

## 2022-01-25 DIAGNOSIS — M159 Polyosteoarthritis, unspecified: Secondary | ICD-10-CM | POA: Diagnosis not present

## 2022-01-25 DIAGNOSIS — E785 Hyperlipidemia, unspecified: Secondary | ICD-10-CM | POA: Diagnosis not present

## 2022-01-25 DIAGNOSIS — E559 Vitamin D deficiency, unspecified: Secondary | ICD-10-CM | POA: Diagnosis not present

## 2022-01-25 DIAGNOSIS — E039 Hypothyroidism, unspecified: Secondary | ICD-10-CM | POA: Diagnosis not present

## 2022-01-25 DIAGNOSIS — R69 Illness, unspecified: Secondary | ICD-10-CM | POA: Diagnosis not present

## 2022-01-25 DIAGNOSIS — M8580 Other specified disorders of bone density and structure, unspecified site: Secondary | ICD-10-CM | POA: Diagnosis not present

## 2022-01-25 DIAGNOSIS — I10 Essential (primary) hypertension: Secondary | ICD-10-CM | POA: Diagnosis not present

## 2022-01-25 DIAGNOSIS — D649 Anemia, unspecified: Secondary | ICD-10-CM | POA: Diagnosis not present

## 2022-01-25 DIAGNOSIS — E669 Obesity, unspecified: Secondary | ICD-10-CM | POA: Diagnosis not present

## 2022-02-05 DIAGNOSIS — Z124 Encounter for screening for malignant neoplasm of cervix: Secondary | ICD-10-CM | POA: Diagnosis not present

## 2022-02-08 DIAGNOSIS — I10 Essential (primary) hypertension: Secondary | ICD-10-CM | POA: Diagnosis not present

## 2022-02-08 DIAGNOSIS — Z6834 Body mass index (BMI) 34.0-34.9, adult: Secondary | ICD-10-CM | POA: Diagnosis not present

## 2022-02-08 DIAGNOSIS — E039 Hypothyroidism, unspecified: Secondary | ICD-10-CM | POA: Diagnosis not present

## 2022-02-08 DIAGNOSIS — M159 Polyosteoarthritis, unspecified: Secondary | ICD-10-CM | POA: Diagnosis not present

## 2022-02-08 DIAGNOSIS — E559 Vitamin D deficiency, unspecified: Secondary | ICD-10-CM | POA: Diagnosis not present

## 2022-02-08 DIAGNOSIS — M8580 Other specified disorders of bone density and structure, unspecified site: Secondary | ICD-10-CM | POA: Diagnosis not present

## 2022-02-08 DIAGNOSIS — E669 Obesity, unspecified: Secondary | ICD-10-CM | POA: Diagnosis not present

## 2022-02-08 DIAGNOSIS — D649 Anemia, unspecified: Secondary | ICD-10-CM | POA: Diagnosis not present

## 2022-02-08 DIAGNOSIS — R69 Illness, unspecified: Secondary | ICD-10-CM | POA: Diagnosis not present

## 2022-02-08 DIAGNOSIS — E785 Hyperlipidemia, unspecified: Secondary | ICD-10-CM | POA: Diagnosis not present

## 2022-02-10 ENCOUNTER — Other Ambulatory Visit: Payer: Self-pay | Admitting: Obstetrics & Gynecology

## 2022-02-10 DIAGNOSIS — N6320 Unspecified lump in the left breast, unspecified quadrant: Secondary | ICD-10-CM

## 2022-02-22 DIAGNOSIS — D649 Anemia, unspecified: Secondary | ICD-10-CM | POA: Diagnosis not present

## 2022-02-22 DIAGNOSIS — E039 Hypothyroidism, unspecified: Secondary | ICD-10-CM | POA: Diagnosis not present

## 2022-02-22 DIAGNOSIS — M159 Polyosteoarthritis, unspecified: Secondary | ICD-10-CM | POA: Diagnosis not present

## 2022-02-22 DIAGNOSIS — I10 Essential (primary) hypertension: Secondary | ICD-10-CM | POA: Diagnosis not present

## 2022-02-22 DIAGNOSIS — E785 Hyperlipidemia, unspecified: Secondary | ICD-10-CM | POA: Diagnosis not present

## 2022-02-22 DIAGNOSIS — E559 Vitamin D deficiency, unspecified: Secondary | ICD-10-CM | POA: Diagnosis not present

## 2022-02-22 DIAGNOSIS — R69 Illness, unspecified: Secondary | ICD-10-CM | POA: Diagnosis not present

## 2022-02-22 DIAGNOSIS — Z6834 Body mass index (BMI) 34.0-34.9, adult: Secondary | ICD-10-CM | POA: Diagnosis not present

## 2022-02-22 DIAGNOSIS — M8580 Other specified disorders of bone density and structure, unspecified site: Secondary | ICD-10-CM | POA: Diagnosis not present

## 2022-03-09 ENCOUNTER — Other Ambulatory Visit: Payer: Self-pay | Admitting: Obstetrics & Gynecology

## 2022-03-09 ENCOUNTER — Ambulatory Visit
Admission: RE | Admit: 2022-03-09 | Discharge: 2022-03-09 | Disposition: A | Discharge status: Home / Self Care | Payer: BC Managed Care – PPO | Source: Ambulatory Visit | Attending: Obstetrics & Gynecology | Admitting: Obstetrics & Gynecology

## 2022-03-09 ENCOUNTER — Ambulatory Visit
Admission: RE | Admit: 2022-03-09 | Discharge: 2022-03-09 | Disposition: A | Discharge status: Home / Self Care | Payer: 59 | Source: Ambulatory Visit | Attending: Obstetrics & Gynecology | Admitting: Obstetrics & Gynecology

## 2022-03-09 DIAGNOSIS — N6320 Unspecified lump in the left breast, unspecified quadrant: Secondary | ICD-10-CM

## 2022-03-09 DIAGNOSIS — R922 Inconclusive mammogram: Secondary | ICD-10-CM | POA: Diagnosis not present

## 2022-03-22 DIAGNOSIS — I10 Essential (primary) hypertension: Secondary | ICD-10-CM | POA: Diagnosis not present

## 2022-03-22 DIAGNOSIS — M8580 Other specified disorders of bone density and structure, unspecified site: Secondary | ICD-10-CM | POA: Diagnosis not present

## 2022-03-22 DIAGNOSIS — E559 Vitamin D deficiency, unspecified: Secondary | ICD-10-CM | POA: Diagnosis not present

## 2022-03-22 DIAGNOSIS — D649 Anemia, unspecified: Secondary | ICD-10-CM | POA: Diagnosis not present

## 2022-03-22 DIAGNOSIS — E039 Hypothyroidism, unspecified: Secondary | ICD-10-CM | POA: Diagnosis not present

## 2022-03-22 DIAGNOSIS — R69 Illness, unspecified: Secondary | ICD-10-CM | POA: Diagnosis not present

## 2022-03-22 DIAGNOSIS — E785 Hyperlipidemia, unspecified: Secondary | ICD-10-CM | POA: Diagnosis not present

## 2022-03-22 DIAGNOSIS — M159 Polyosteoarthritis, unspecified: Secondary | ICD-10-CM | POA: Diagnosis not present

## 2022-03-22 DIAGNOSIS — Z6834 Body mass index (BMI) 34.0-34.9, adult: Secondary | ICD-10-CM | POA: Diagnosis not present

## 2022-05-10 DIAGNOSIS — E785 Hyperlipidemia, unspecified: Secondary | ICD-10-CM | POA: Diagnosis not present

## 2022-05-10 DIAGNOSIS — E669 Obesity, unspecified: Secondary | ICD-10-CM | POA: Diagnosis not present

## 2022-05-10 DIAGNOSIS — M8580 Other specified disorders of bone density and structure, unspecified site: Secondary | ICD-10-CM | POA: Diagnosis not present

## 2022-05-10 DIAGNOSIS — F5101 Primary insomnia: Secondary | ICD-10-CM | POA: Diagnosis not present

## 2022-05-10 DIAGNOSIS — E039 Hypothyroidism, unspecified: Secondary | ICD-10-CM | POA: Diagnosis not present

## 2022-05-10 DIAGNOSIS — I10 Essential (primary) hypertension: Secondary | ICD-10-CM | POA: Diagnosis not present

## 2022-05-10 DIAGNOSIS — M159 Polyosteoarthritis, unspecified: Secondary | ICD-10-CM | POA: Diagnosis not present

## 2022-05-10 DIAGNOSIS — Z6834 Body mass index (BMI) 34.0-34.9, adult: Secondary | ICD-10-CM | POA: Diagnosis not present

## 2022-05-10 DIAGNOSIS — R69 Illness, unspecified: Secondary | ICD-10-CM | POA: Diagnosis not present

## 2022-05-10 DIAGNOSIS — F3341 Major depressive disorder, recurrent, in partial remission: Secondary | ICD-10-CM | POA: Diagnosis not present

## 2022-05-10 DIAGNOSIS — D649 Anemia, unspecified: Secondary | ICD-10-CM | POA: Diagnosis not present

## 2022-05-10 DIAGNOSIS — E559 Vitamin D deficiency, unspecified: Secondary | ICD-10-CM | POA: Diagnosis not present

## 2022-06-07 DIAGNOSIS — I10 Essential (primary) hypertension: Secondary | ICD-10-CM | POA: Diagnosis not present

## 2022-06-07 DIAGNOSIS — E559 Vitamin D deficiency, unspecified: Secondary | ICD-10-CM | POA: Diagnosis not present

## 2022-06-07 DIAGNOSIS — E039 Hypothyroidism, unspecified: Secondary | ICD-10-CM | POA: Diagnosis not present

## 2022-06-07 DIAGNOSIS — E669 Obesity, unspecified: Secondary | ICD-10-CM | POA: Diagnosis not present

## 2022-06-07 DIAGNOSIS — R69 Illness, unspecified: Secondary | ICD-10-CM | POA: Diagnosis not present

## 2022-06-07 DIAGNOSIS — D649 Anemia, unspecified: Secondary | ICD-10-CM | POA: Diagnosis not present

## 2022-06-07 DIAGNOSIS — M159 Polyosteoarthritis, unspecified: Secondary | ICD-10-CM | POA: Diagnosis not present

## 2022-06-07 DIAGNOSIS — E785 Hyperlipidemia, unspecified: Secondary | ICD-10-CM | POA: Diagnosis not present

## 2022-06-07 DIAGNOSIS — Z6834 Body mass index (BMI) 34.0-34.9, adult: Secondary | ICD-10-CM | POA: Diagnosis not present

## 2022-06-07 DIAGNOSIS — M8580 Other specified disorders of bone density and structure, unspecified site: Secondary | ICD-10-CM | POA: Diagnosis not present

## 2022-06-11 ENCOUNTER — Other Ambulatory Visit: Payer: 59

## 2022-06-11 DIAGNOSIS — H2513 Age-related nuclear cataract, bilateral: Secondary | ICD-10-CM | POA: Diagnosis not present

## 2022-06-11 DIAGNOSIS — H52223 Regular astigmatism, bilateral: Secondary | ICD-10-CM | POA: Diagnosis not present

## 2022-06-11 DIAGNOSIS — H18593 Other hereditary corneal dystrophies, bilateral: Secondary | ICD-10-CM | POA: Diagnosis not present

## 2022-06-11 DIAGNOSIS — H5203 Hypermetropia, bilateral: Secondary | ICD-10-CM | POA: Diagnosis not present

## 2022-06-11 DIAGNOSIS — H35371 Puckering of macula, right eye: Secondary | ICD-10-CM | POA: Diagnosis not present

## 2022-06-11 DIAGNOSIS — H524 Presbyopia: Secondary | ICD-10-CM | POA: Diagnosis not present

## 2022-06-11 DIAGNOSIS — H43813 Vitreous degeneration, bilateral: Secondary | ICD-10-CM | POA: Diagnosis not present

## 2022-06-12 ENCOUNTER — Other Ambulatory Visit: Payer: Self-pay | Admitting: Obstetrics & Gynecology

## 2022-06-12 ENCOUNTER — Ambulatory Visit
Admission: RE | Admit: 2022-06-12 | Discharge: 2022-06-12 | Disposition: A | Discharge status: Home / Self Care | Payer: 59 | Source: Ambulatory Visit | Attending: Obstetrics & Gynecology | Admitting: Obstetrics & Gynecology

## 2022-06-12 DIAGNOSIS — N6320 Unspecified lump in the left breast, unspecified quadrant: Secondary | ICD-10-CM

## 2022-06-12 DIAGNOSIS — R928 Other abnormal and inconclusive findings on diagnostic imaging of breast: Secondary | ICD-10-CM | POA: Diagnosis not present

## 2022-06-12 DIAGNOSIS — N6489 Other specified disorders of breast: Secondary | ICD-10-CM | POA: Diagnosis not present

## 2022-07-17 DIAGNOSIS — Z6835 Body mass index (BMI) 35.0-35.9, adult: Secondary | ICD-10-CM | POA: Diagnosis not present

## 2022-07-17 DIAGNOSIS — M8580 Other specified disorders of bone density and structure, unspecified site: Secondary | ICD-10-CM | POA: Diagnosis not present

## 2022-07-17 DIAGNOSIS — E669 Obesity, unspecified: Secondary | ICD-10-CM | POA: Diagnosis not present

## 2022-07-17 DIAGNOSIS — E559 Vitamin D deficiency, unspecified: Secondary | ICD-10-CM | POA: Diagnosis not present

## 2022-07-17 DIAGNOSIS — M159 Polyosteoarthritis, unspecified: Secondary | ICD-10-CM | POA: Diagnosis not present

## 2022-07-17 DIAGNOSIS — R69 Illness, unspecified: Secondary | ICD-10-CM | POA: Diagnosis not present

## 2022-07-17 DIAGNOSIS — D649 Anemia, unspecified: Secondary | ICD-10-CM | POA: Diagnosis not present

## 2022-07-17 DIAGNOSIS — E785 Hyperlipidemia, unspecified: Secondary | ICD-10-CM | POA: Diagnosis not present

## 2022-07-17 DIAGNOSIS — I10 Essential (primary) hypertension: Secondary | ICD-10-CM | POA: Diagnosis not present

## 2022-07-17 DIAGNOSIS — E039 Hypothyroidism, unspecified: Secondary | ICD-10-CM | POA: Diagnosis not present

## 2022-08-08 DIAGNOSIS — I10 Essential (primary) hypertension: Secondary | ICD-10-CM | POA: Diagnosis not present

## 2022-08-08 DIAGNOSIS — R5382 Chronic fatigue, unspecified: Secondary | ICD-10-CM | POA: Diagnosis not present

## 2022-08-08 DIAGNOSIS — Z6834 Body mass index (BMI) 34.0-34.9, adult: Secondary | ICD-10-CM | POA: Diagnosis not present

## 2022-08-08 DIAGNOSIS — D649 Anemia, unspecified: Secondary | ICD-10-CM | POA: Diagnosis not present

## 2022-08-08 DIAGNOSIS — E559 Vitamin D deficiency, unspecified: Secondary | ICD-10-CM | POA: Diagnosis not present

## 2022-08-08 DIAGNOSIS — E039 Hypothyroidism, unspecified: Secondary | ICD-10-CM | POA: Diagnosis not present

## 2022-08-08 DIAGNOSIS — M549 Dorsalgia, unspecified: Secondary | ICD-10-CM | POA: Diagnosis not present

## 2022-08-08 DIAGNOSIS — R69 Illness, unspecified: Secondary | ICD-10-CM | POA: Diagnosis not present

## 2022-08-08 DIAGNOSIS — J069 Acute upper respiratory infection, unspecified: Secondary | ICD-10-CM | POA: Diagnosis not present

## 2022-08-17 DIAGNOSIS — E559 Vitamin D deficiency, unspecified: Secondary | ICD-10-CM | POA: Diagnosis not present

## 2022-08-17 DIAGNOSIS — Z6835 Body mass index (BMI) 35.0-35.9, adult: Secondary | ICD-10-CM | POA: Diagnosis not present

## 2022-08-17 DIAGNOSIS — E039 Hypothyroidism, unspecified: Secondary | ICD-10-CM | POA: Diagnosis not present

## 2022-08-17 DIAGNOSIS — M549 Dorsalgia, unspecified: Secondary | ICD-10-CM | POA: Diagnosis not present

## 2022-08-17 DIAGNOSIS — J069 Acute upper respiratory infection, unspecified: Secondary | ICD-10-CM | POA: Diagnosis not present

## 2022-08-17 DIAGNOSIS — R69 Illness, unspecified: Secondary | ICD-10-CM | POA: Diagnosis not present

## 2022-08-17 DIAGNOSIS — I10 Essential (primary) hypertension: Secondary | ICD-10-CM | POA: Diagnosis not present

## 2022-08-17 DIAGNOSIS — D649 Anemia, unspecified: Secondary | ICD-10-CM | POA: Diagnosis not present

## 2022-09-14 ENCOUNTER — Other Ambulatory Visit: Payer: 59

## 2022-11-28 IMAGING — MG DIGITAL DIAGNOSTIC BILAT W/ TOMO W/ CAD
6 of 10 series · 6 of 30 positions shown · non-contrast
Comparison: Previous exam(s).

CLINICAL DATA: 60-year-old female with palpable lump in the UPPER
INNER LEFT breast and for annual bilateral mammogram.

EXAM:
DIGITAL DIAGNOSTIC BILATERAL MAMMOGRAM WITH TOMOSYNTHESIS AND CAD;
ULTRASOUND LEFT BREAST LIMITED
TECHNIQUE: Bilateral digital diagnostic mammography and breast tomosynthesis
was performed. The images were evaluated with computer-aided
detection.; Targeted ultrasound examination of the left breast was
performed.

[R MLO synth-2D]
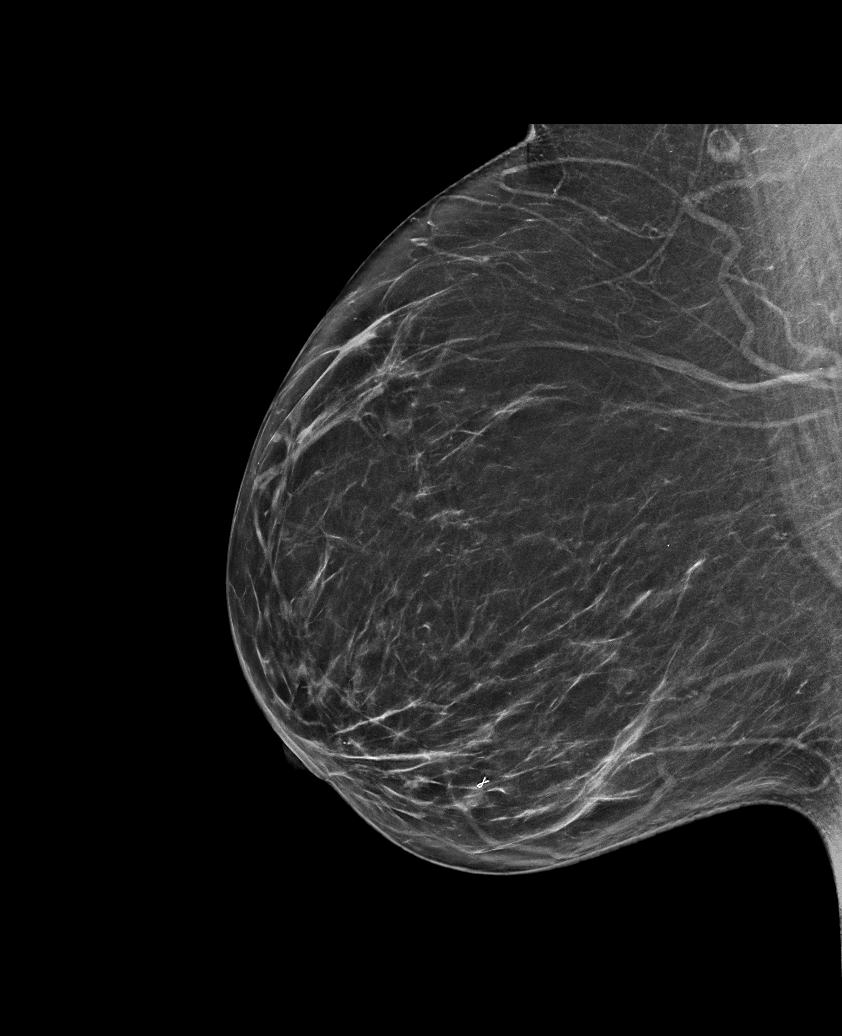

[L TAN synth-2D]
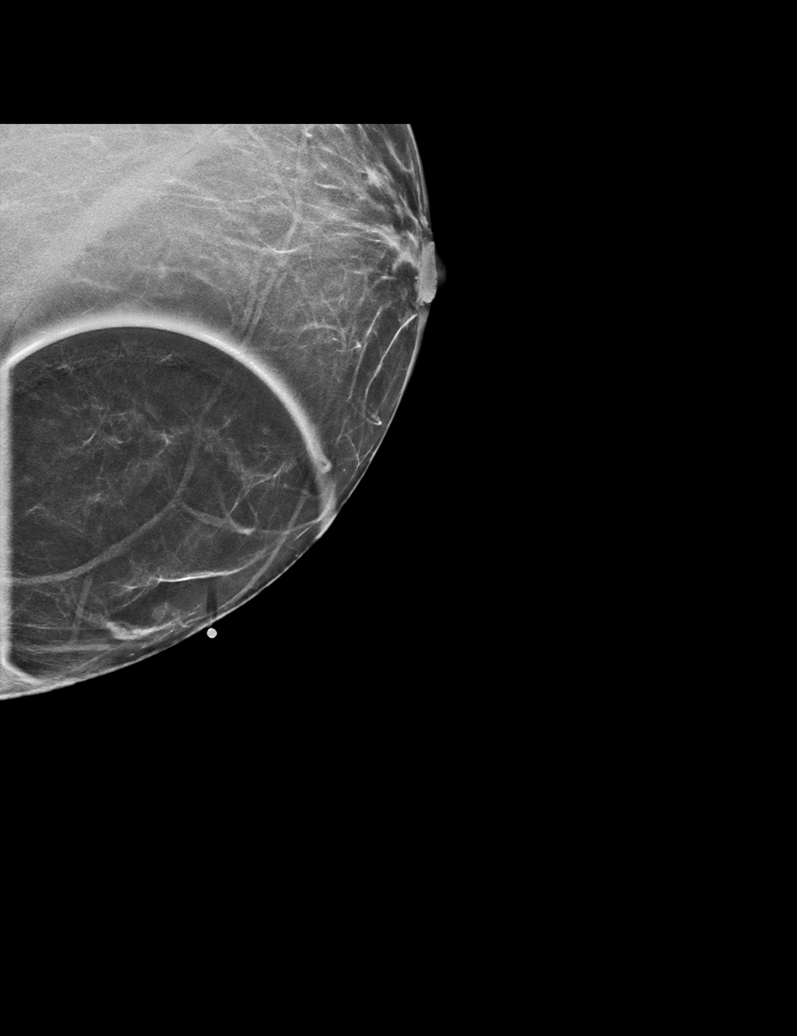

[L MLO synth-2D]
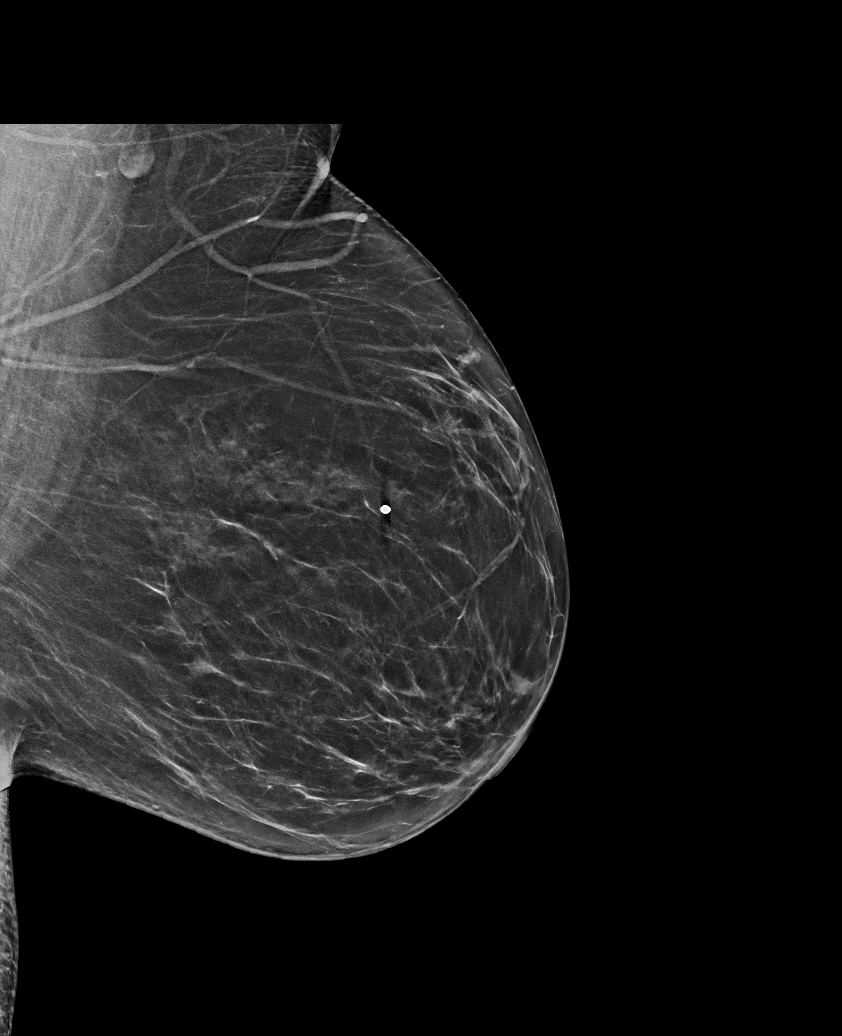

[R CC synth-2D]
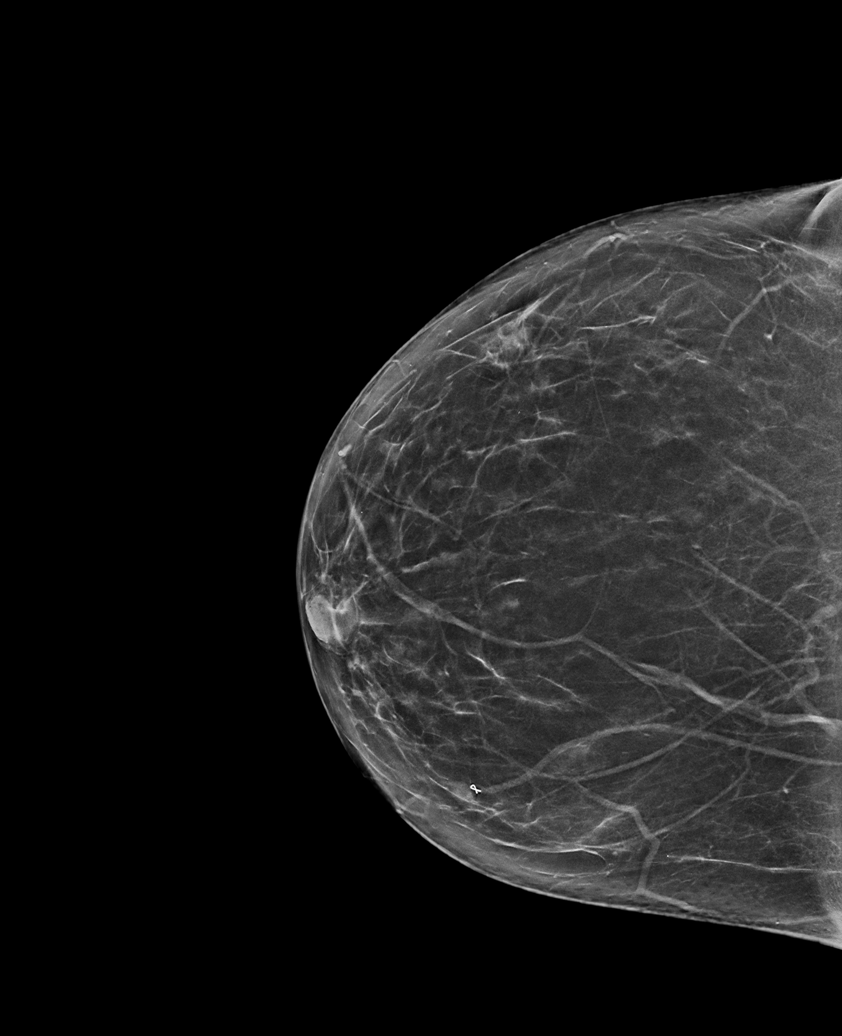

[L CC synth-2D]
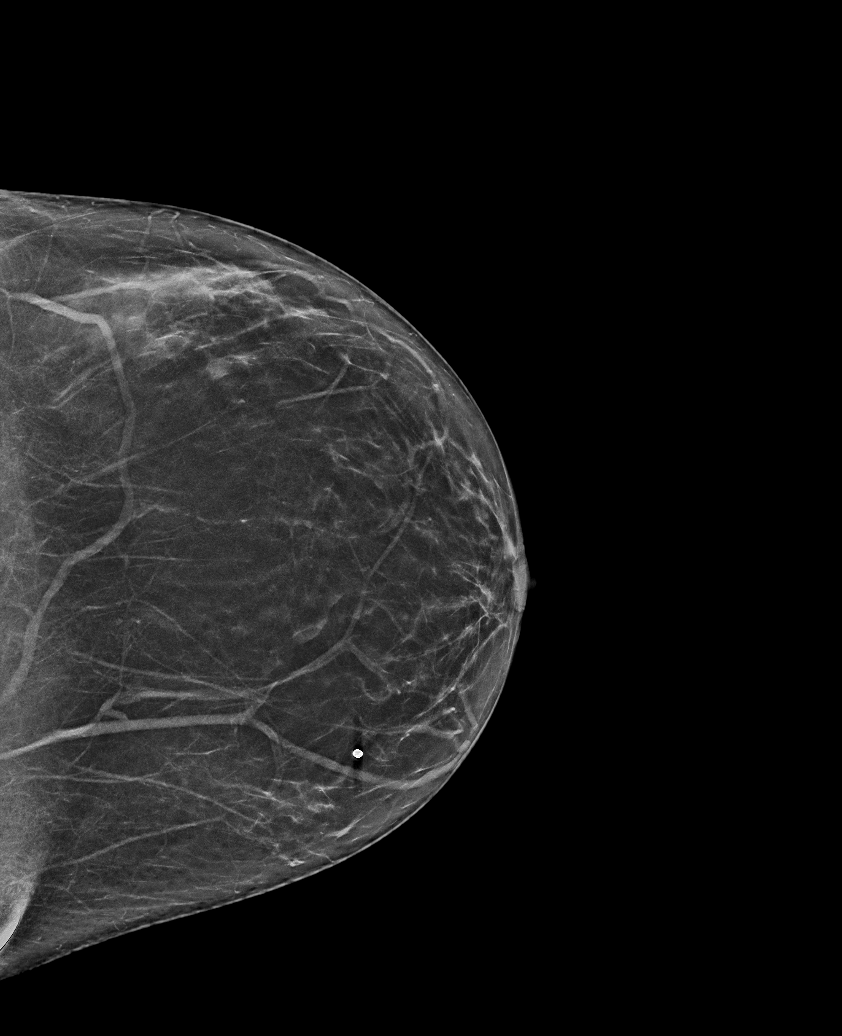

[L MLO tomo · tomo slice 35/69.0]
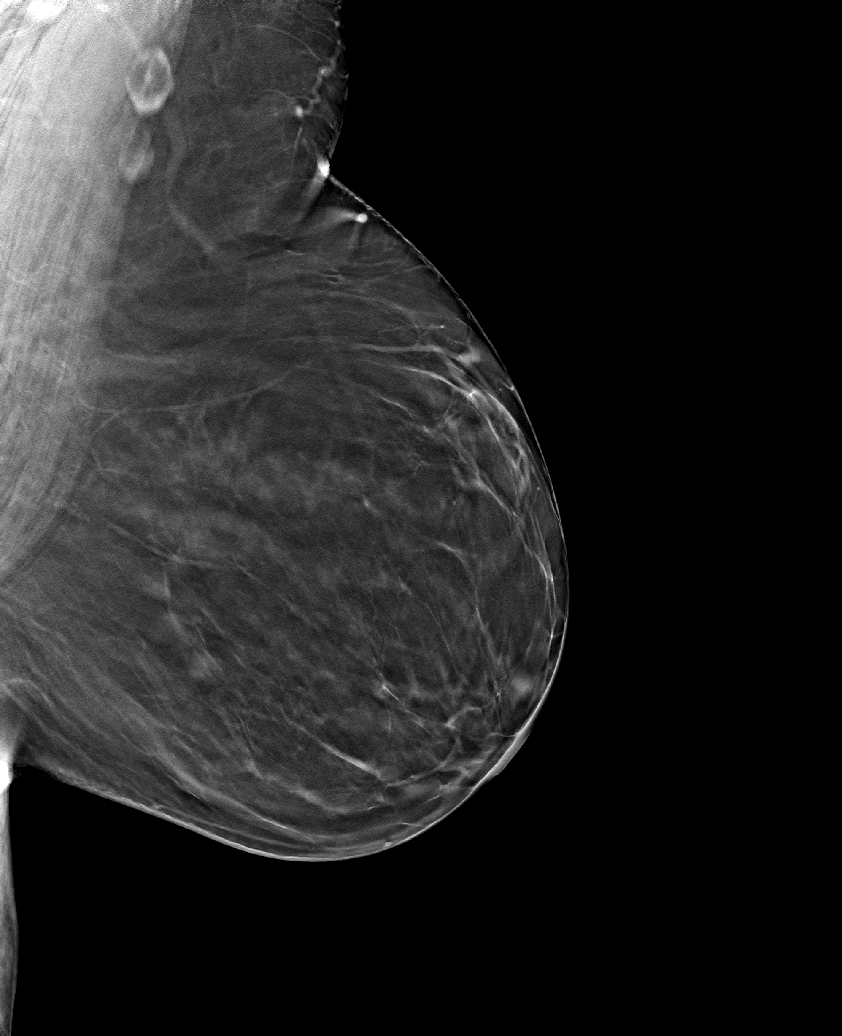

[6 of 30 positions shown; findings below may reference images not displayed]

ACR Breast Density Category b: There are scattered areas of
fibroglandular density.
FINDINGS: 2D/3D full field views of both breasts and spot compression view of
the LEFT breast demonstrate a faint superficial asymmetry in the
UPPER OUTER LEFT breast.

No suspicious mass, distortion or worrisome calcifications are
noted.

On physical exam, a small palpable area of thickening is identified
at the 10 o'clock position of the LEFT breast 7 cm from the nipple.

Targeted ultrasound is performed, showing a 1.4 x 0.5 x 1.3 cm
ill-defined hyperechoic area with central hypoechoic region, located
at the 10 o'clock position of the LEFT breast 7 cm from the nipple.
This corresponds to the patient's palpable area and likely
represents fat necrosis.
IMPRESSION: 1. Probable fat necrosis changes within the UPPER INNER LEFT breast
corresponding to the patient's palpable abnormality. 3 month
follow-up is recommended to ensure stability.

RECOMMENDATION:
LEFT diagnostic mammogram and LEFT breast ultrasound in 3 months.

I have discussed the findings and recommendations with the patient.
If applicable, a reminder letter will be sent to the patient
regarding the next appointment.

BI-RADS CATEGORY  3: Probably benign.

## 2023-02-26 ENCOUNTER — Other Ambulatory Visit: Payer: Self-pay | Admitting: Obstetrics & Gynecology

## 2023-02-26 DIAGNOSIS — R928 Other abnormal and inconclusive findings on diagnostic imaging of breast: Secondary | ICD-10-CM

## 2023-07-02 ENCOUNTER — Ambulatory Visit
Admission: RE | Admit: 2023-07-02 | Discharge: 2023-07-02 | Disposition: A | Discharge status: Home / Self Care | Payer: Medicare HMO | Source: Ambulatory Visit | Attending: Obstetrics & Gynecology | Admitting: Obstetrics & Gynecology

## 2023-07-02 ENCOUNTER — Ambulatory Visit
Admission: RE | Admit: 2023-07-02 | Discharge: 2023-07-02 | Disposition: A | Discharge status: Home / Self Care | Payer: 59 | Source: Ambulatory Visit | Attending: Obstetrics & Gynecology | Admitting: Obstetrics & Gynecology

## 2023-07-02 DIAGNOSIS — R928 Other abnormal and inconclusive findings on diagnostic imaging of breast: Secondary | ICD-10-CM

## 2023-07-19 ENCOUNTER — Other Ambulatory Visit: Payer: Self-pay | Admitting: Internal Medicine

## 2023-07-19 DIAGNOSIS — M810 Age-related osteoporosis without current pathological fracture: Secondary | ICD-10-CM

## 2024-01-21 ENCOUNTER — Other Ambulatory Visit: Payer: Medicare HMO
